# Patient Record
Sex: Female | Born: 2000 | Race: White | Hispanic: No | Marital: Single | Smoking: Former smoker
Health system: Southern US, Community
[De-identification: ages and names within clinical notes are randomized; demographics above are authoritative.]

## PROBLEM LIST (undated history)

## (undated) ENCOUNTER — Inpatient Hospital Stay (HOSPITAL_COMMUNITY): Payer: Self-pay

## (undated) ENCOUNTER — Ambulatory Visit (HOSPITAL_COMMUNITY): Admission: EM | Payer: MEDICAID | Source: Home / Self Care

## (undated) DIAGNOSIS — R519 Headache, unspecified: Secondary | ICD-10-CM

## (undated) DIAGNOSIS — S42301A Unspecified fracture of shaft of humerus, right arm, initial encounter for closed fracture: Secondary | ICD-10-CM

## (undated) DIAGNOSIS — I498 Other specified cardiac arrhythmias: Secondary | ICD-10-CM

## (undated) DIAGNOSIS — G90A Postural orthostatic tachycardia syndrome (POTS): Secondary | ICD-10-CM

## (undated) DIAGNOSIS — F419 Anxiety disorder, unspecified: Secondary | ICD-10-CM

## (undated) DIAGNOSIS — N390 Urinary tract infection, site not specified: Secondary | ICD-10-CM

## (undated) DIAGNOSIS — J351 Hypertrophy of tonsils: Secondary | ICD-10-CM

## (undated) DIAGNOSIS — J45909 Unspecified asthma, uncomplicated: Secondary | ICD-10-CM

## (undated) DIAGNOSIS — F32A Depression, unspecified: Secondary | ICD-10-CM

## (undated) DIAGNOSIS — S022XXA Fracture of nasal bones, initial encounter for closed fracture: Secondary | ICD-10-CM

## (undated) DIAGNOSIS — R51 Headache: Secondary | ICD-10-CM

## (undated) DIAGNOSIS — J302 Other seasonal allergic rhinitis: Secondary | ICD-10-CM

## (undated) DIAGNOSIS — S42302A Unspecified fracture of shaft of humerus, left arm, initial encounter for closed fracture: Secondary | ICD-10-CM

## (undated) HISTORY — PX: TONSILECTOMY, ADENOIDECTOMY, BILATERAL MYRINGOTOMY AND TUBES: SHX2538

## (undated) HISTORY — DX: Headache, unspecified: R51.9

## (undated) HISTORY — PX: TONSILLECTOMY: SUR1361

## (undated) HISTORY — DX: Headache: R51

---
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
## 2022-07-22 ENCOUNTER — Telehealth: Payer: Medicaid Other | Admitting: Physician Assistant

## 2022-07-22 DIAGNOSIS — K047 Periapical abscess without sinus: Secondary | ICD-10-CM

## 2022-07-22 MED ORDER — AMOXICILLIN-POT CLAVULANATE 875-125 MG PO TABS: 1.0000 | ORAL_TABLET | Freq: Two times a day (BID) | ORAL | 0 refills | Status: DC

## 2022-07-22 MED ORDER — MELOXICAM 15 MG PO TABS: 15.0000 mg | ORAL_TABLET | Freq: Every day | ORAL | 0 refills | Status: DC

## 2022-07-22 NOTE — Progress Notes (Signed)
Virtual Visit Consent   Robin Pineda, you are scheduled for a virtual visit with a [facility] provider today. Just as with appointments in the office, your consent must be obtained to participate. Your consent will be active for this visit and any virtual visit you may have with one of our providers in the next 365 days. If you have a MyChart account, a copy of this consent can be sent to you electronically.  As this is a virtual visit, video technology does not allow for your provider to perform a traditional examination. This may limit your provider's ability to fully assess your condition. If your provider identifies any concerns that need to be evaluated in person or the need to arrange testing (such as labs, EKG, etc.), we will make arrangements to do so. Although advances in technology are sophisticated, we cannot ensure that it will always work on either your end or our end. If the connection with a video visit is poor, the visit may have to be switched to a telephone visit. With either a video or telephone visit, we are not always able to ensure that we have a secure connection.  By engaging in this virtual visit, you consent to the provision of healthcare and authorize for your insurance to be billed (if applicable) for the services provided during this visit. Depending on your insurance coverage, you may receive a charge related to this service.  I need to obtain your verbal consent now. Are you willing to proceed with your visit today? Robin Pineda has provided verbal consent on 07/22/2022 for a virtual visit (video or telephone). Leeanne Rio, [place]  Date: 07/22/2022 10:01 AM  Virtual Visit via Video Note   I, Leeanne Rio, connected with  Robin Pineda  ([id number], 2001-04-26) on 07/22/22 at  9:45 AM EDT by a video-enabled telemedicine application and verified that I am speaking with the correct person using two identifiers.  Location: Patient: Virtual Visit  Location Patient: Home Provider: Virtual Visit Location Provider: Home Office   I discussed the limitations of evaluation and management by telemedicine and the availability of in person appointments. The patient expressed understanding and agreed to proceed.    History of Present Illness: Robin Pineda is a 22 y.o. who identifies as a female who was assigned female at birth, and is being seen today for tooth infection. Was supposed to have a top let premolar pulled last week but was unable to make it to her appointment. Has not been rescheduled yet for extraction. Over past couple of days with increased pain in this tooth now with gingival swelling. Denies fever, chills. Taking OTC Tylenol but only mildly beneficial.    HPI: HPI  Problems:  Patient Active Problem List   Diagnosis Date Noted   MDD (major depressive disorder), recurrent, severe, with psychosis ([facility]) 11/06/2017   Migraine without aura and without status migrainosus, not intractable 03/04/2014   Episodic tension-type headache 03/04/2014   Obesity 03/04/2014   Disequilibrium 03/04/2014    Allergies:  Allergies  Allergen Reactions   Cinnamon Hives   Cinnamon Swelling   Medications:  Current Outpatient Medications:    amoxicillin-clavulanate (AUGMENTIN) 875-125 MG tablet, Take 1 tablet by mouth 2 (two) times daily., Disp: 14 tablet, Rfl: 0   meloxicam (MOBIC) 15 MG tablet, Take 1 tablet (15 mg total) by mouth daily., Disp: 15 tablet, Rfl: 0  Observations/Objective: Patient is well-developed, well-nourished in no acute distress.  Resting comfortably  at home.  Head is normocephalic, atraumatic.  No labored breathing.  Speech is clear and coherent with logical content.  Patient is alert and oriented at baseline.    Assessment and Plan: 1. Dental infection - amoxicillin-clavulanate (AUGMENTIN) 875-125 MG tablet; Take 1 tablet by mouth 2 (two) times daily.  Dispense: 14 tablet; Refill: 0 - meloxicam (MOBIC) 15 MG  tablet; Take 1 tablet (15 mg total) by mouth daily.  Dispense: 15 tablet; Refill: 0  Start Augmentin per orders. Ok to continue Tylenol OTC. Will add-on Meloxicam once daily. Recommend Peroxyl mouthwash and use of clove paste applied topically.   Follow Up Instructions: I discussed the assessment and treatment plan with the patient. The patient was provided an opportunity to ask questions and all were answered. The patient agreed with the plan and demonstrated an understanding of the instructions.  A copy of instructions were sent to the patient via MyChart unless otherwise noted below.   The patient was advised to call back or seek an in-person evaluation if the symptoms worsen or if the condition fails to improve as anticipated.  Time:  I spent 10 minutes with the patient via telehealth technology discussing the above problems/concerns.    Leeanne Rio, PA-C

## 2022-07-22 NOTE — Patient Instructions (Addendum)
  Robin Pineda, thank you for joining Leeanne Rio, PA-C for today's virtual visit.  While this provider is not your primary care provider (PCP), if your PCP is located in our provider database this encounter information will be shared with them immediately following your visit.   [facility] account gives you access to today's visit and all your visits, tests, and labs performed at [facility] " click here if you don't have a [facility] account or go to [email or web address]  Consent: (Patient) Robin Pineda provided verbal consent for this virtual visit at the beginning of the encounter.  Current Medications:  Current Outpatient Medications:    doxycycline (VIBRA-TABS) 100 MG tablet, Take 1 tablet (100 mg total) by mouth 2 (two) times daily., Disp: 20 tablet, Rfl: 0   EYSUVIS 0.25 % SUSP, Apply 1 drop to eye 2 (two) times daily., Disp: , Rfl:    famotidine (PEPCID) 20 MG tablet, Take 1 tablet (20 mg total) by mouth 2 (two) times daily., Disp: 30 tablet, Rfl: 0   hydrOXYzine (ATARAX) 25 MG tablet, Take 25 mg by mouth every 8 (eight) hours as needed., Disp: , Rfl:    predniSONE (DELTASONE) 20 MG tablet, 3 tabs po daily x 3 days, then 2 tabs x 3 days, then 1.5 tabs x 3 days, then 1 tab x 3 days, then 0.5 tabs x 3 days, Disp: 27 tablet, Rfl: 0   RESTASIS 0.05 % ophthalmic emulsion, 1 drop 2 (two) times daily., Disp: , Rfl:    Medications ordered in this encounter:  No orders of the defined types were placed in this encounter.    *If you need refills on other medications prior to your next appointment, please contact your pharmacy*  Follow-Up: Call back or seek an in-person evaluation if the symptoms worsen or if the condition fails to improve as anticipated.  [facility] [phone]  Other Instructions Please continue dental hygiene.  Start use of OTC Peroxyl mouthwash 1-2 x daily.  You can use a clove paste as we  discussed, to apply topically to the area.  Use the Meloxicam once daily for pain and inflammation. Ok to use OTC Tylenol throughout the day. Call your dentist first thing Monday morning to reschedule your extraction.    If you have been instructed to have an in-person evaluation today at a local Urgent Care facility, please use the link below. It will take you to a list of all of our available [facility], including address, phone number and hours of operation. Please do not delay care.  [facility]  If you or a family member do not have a primary care provider, use the link below to schedule a visit and establish care. When you choose a [facility] primary care physician or advanced practice provider, you gain a long-term partner in health. Find a Primary Care Provider  Learn more about [facility]'s in-office and virtual care options: [facility]

## 2022-08-20 IMAGING — DX DG ANKLE COMPLETE 3+V*R*
3 series · 3 of 3 positions shown · non-contrast
Comparison: 04/21/2014

CLINICAL DATA: Swelling and limited range of motion

EXAM:
RIGHT ANKLE - COMPLETE 3+ VIEW

[ankle ap]
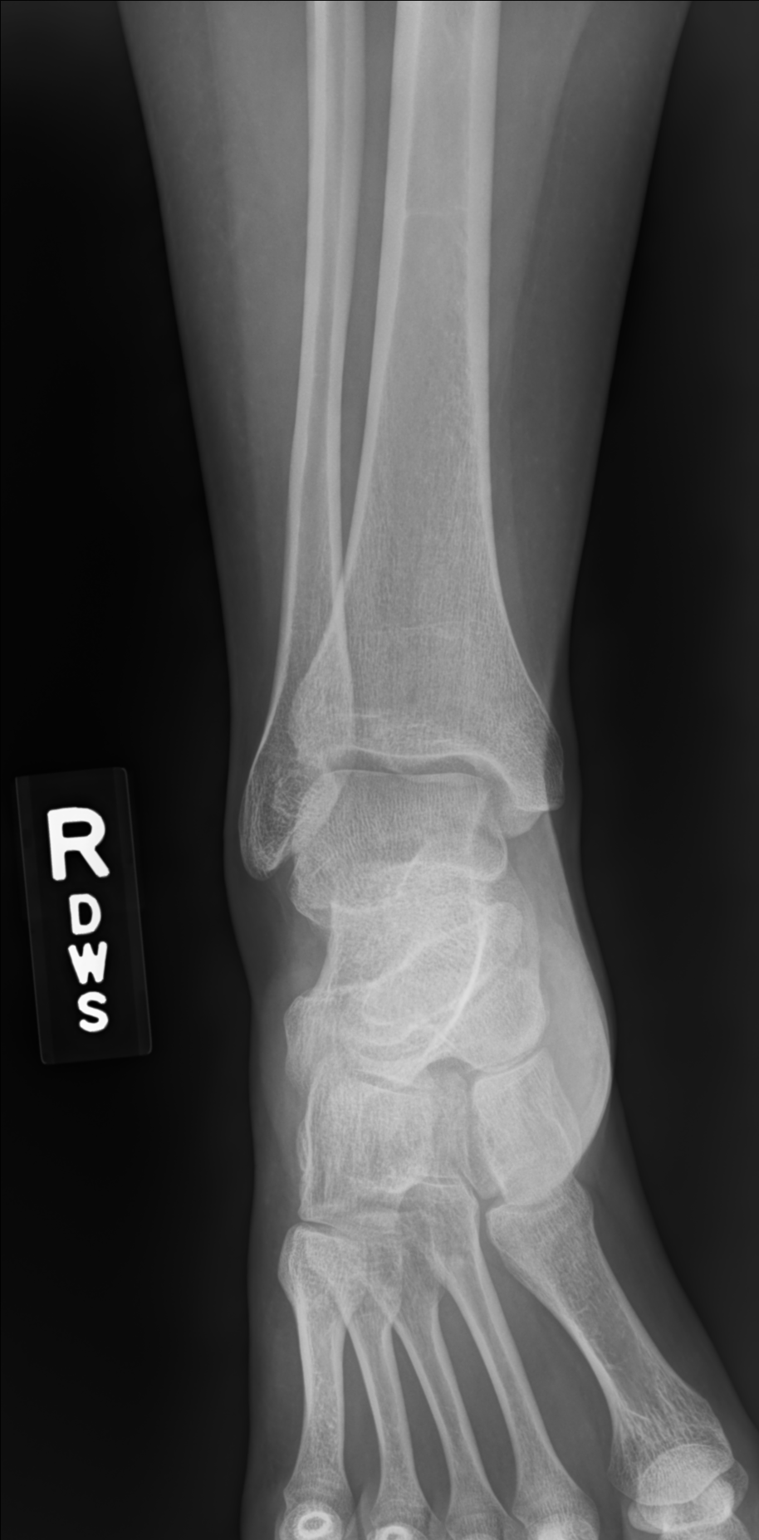

[ankle medial oblique]
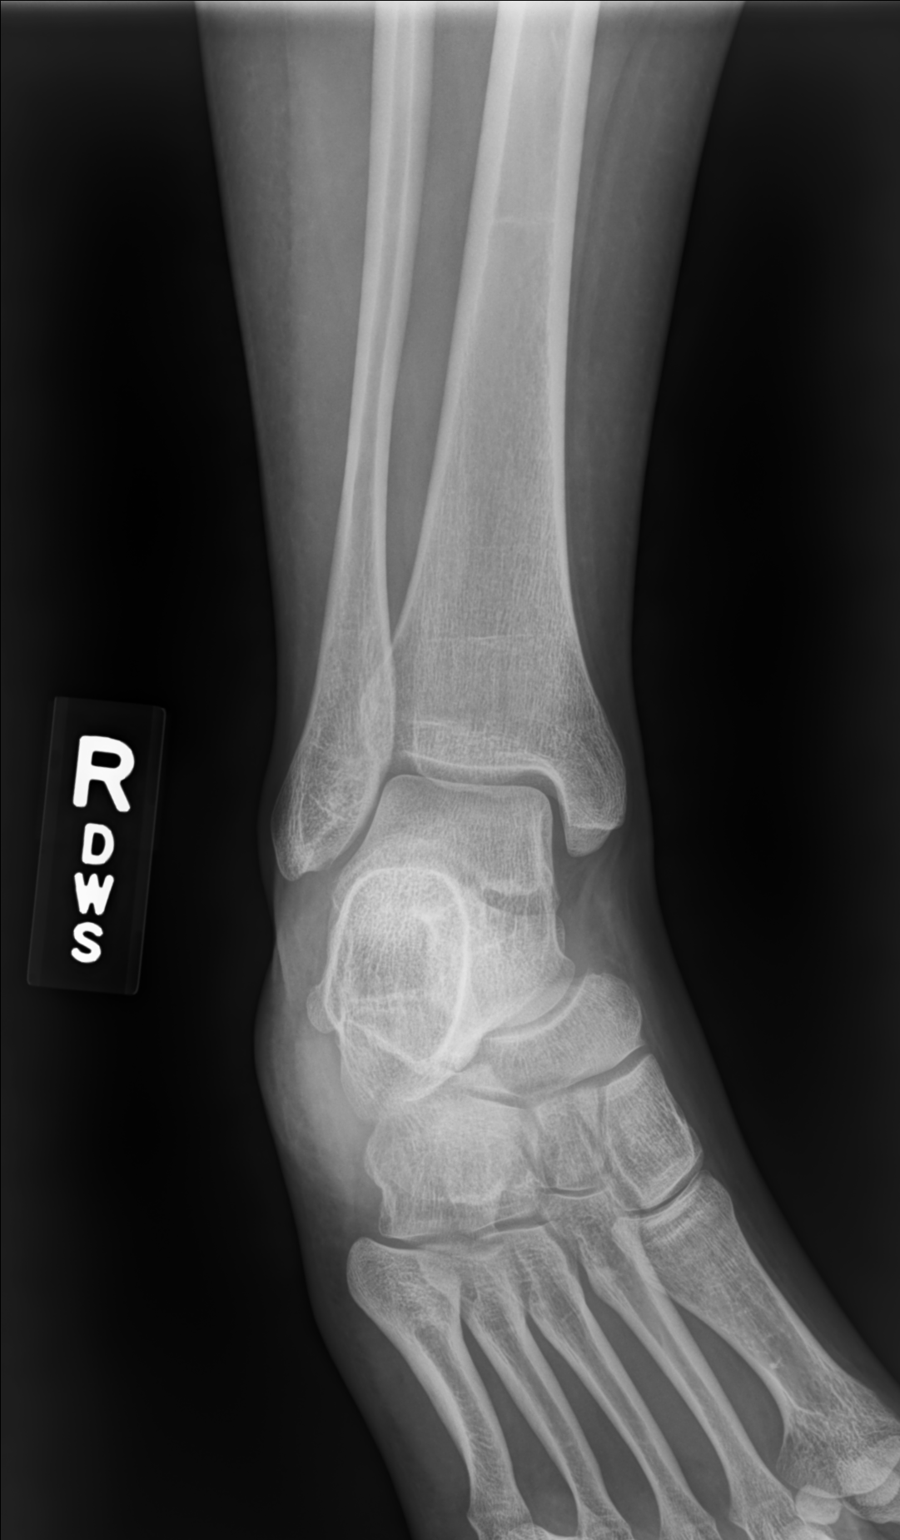

[ankle lat]
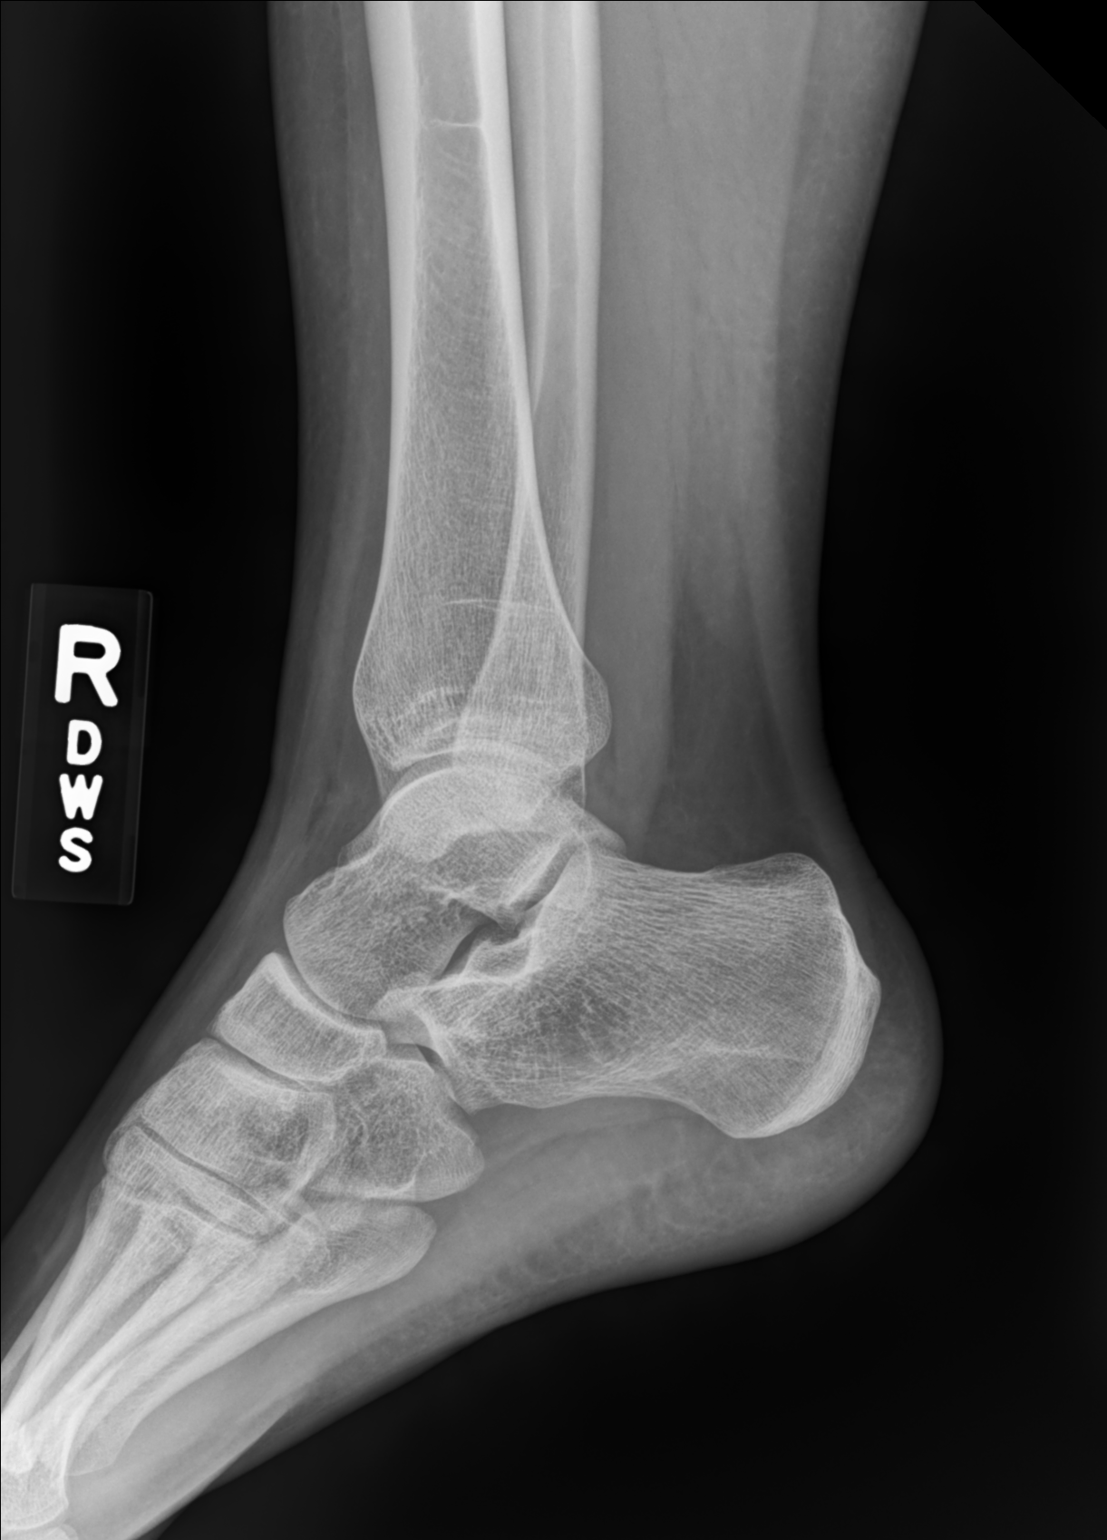

[3 of 3 positions shown; findings below may reference images not displayed]

FINDINGS: There is no evidence of fracture, dislocation, or joint effusion.
There is no evidence of arthropathy or other focal bone abnormality.
Soft tissues are unremarkable.
IMPRESSION: Negative.

## 2022-08-20 IMAGING — DX DG KNEE COMPLETE 4+V*R*
4 series · 4 of 4 positions shown · non-contrast
Comparison: None.

CLINICAL DATA: Swelling and limited range of motion

EXAM:
RIGHT KNEE - COMPLETE 4+ VIEW

[knee standing ap]
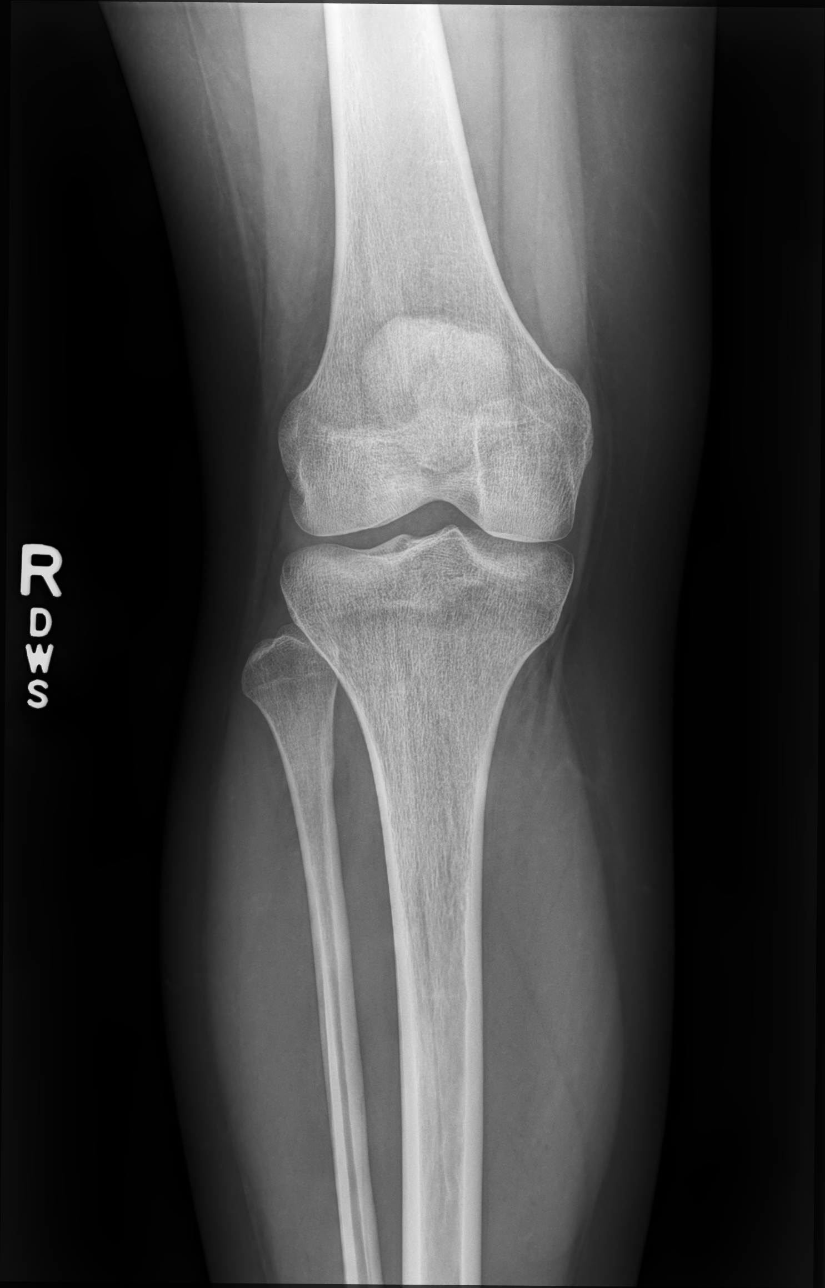

[knee mlo (1 of 2)]
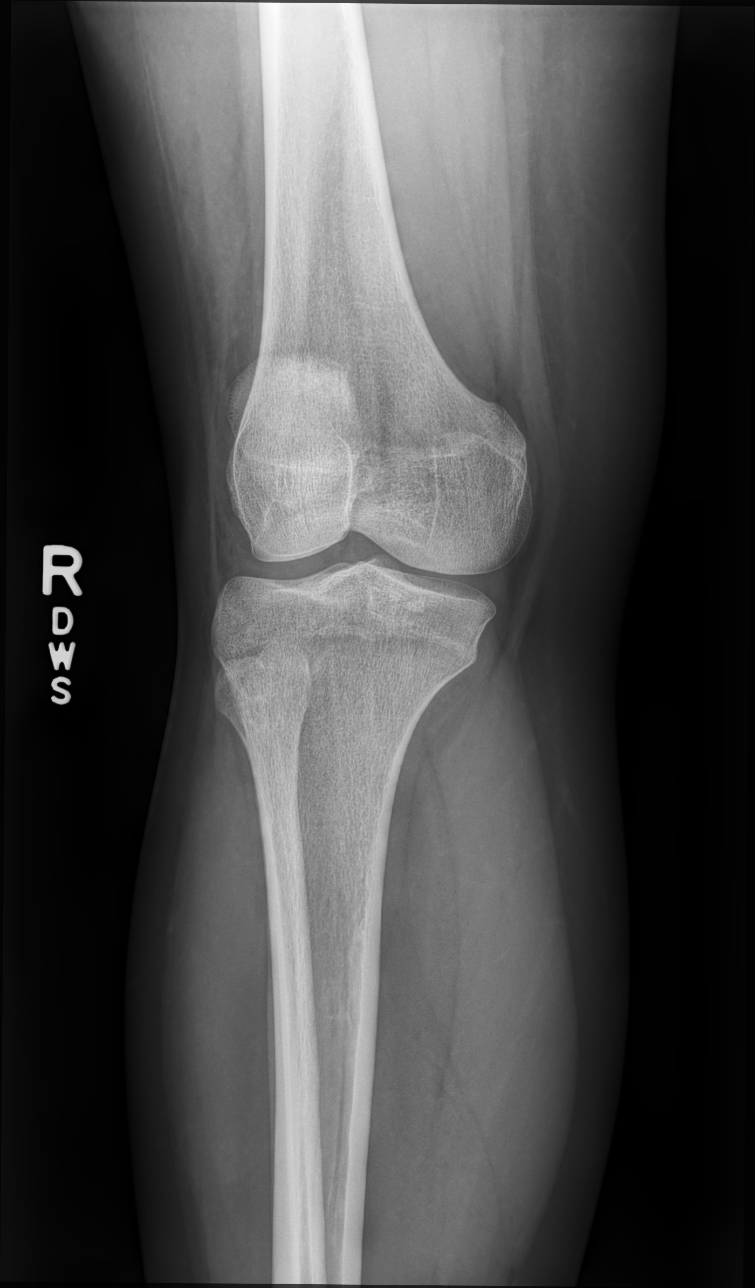

[knee mlo (2 of 2)]
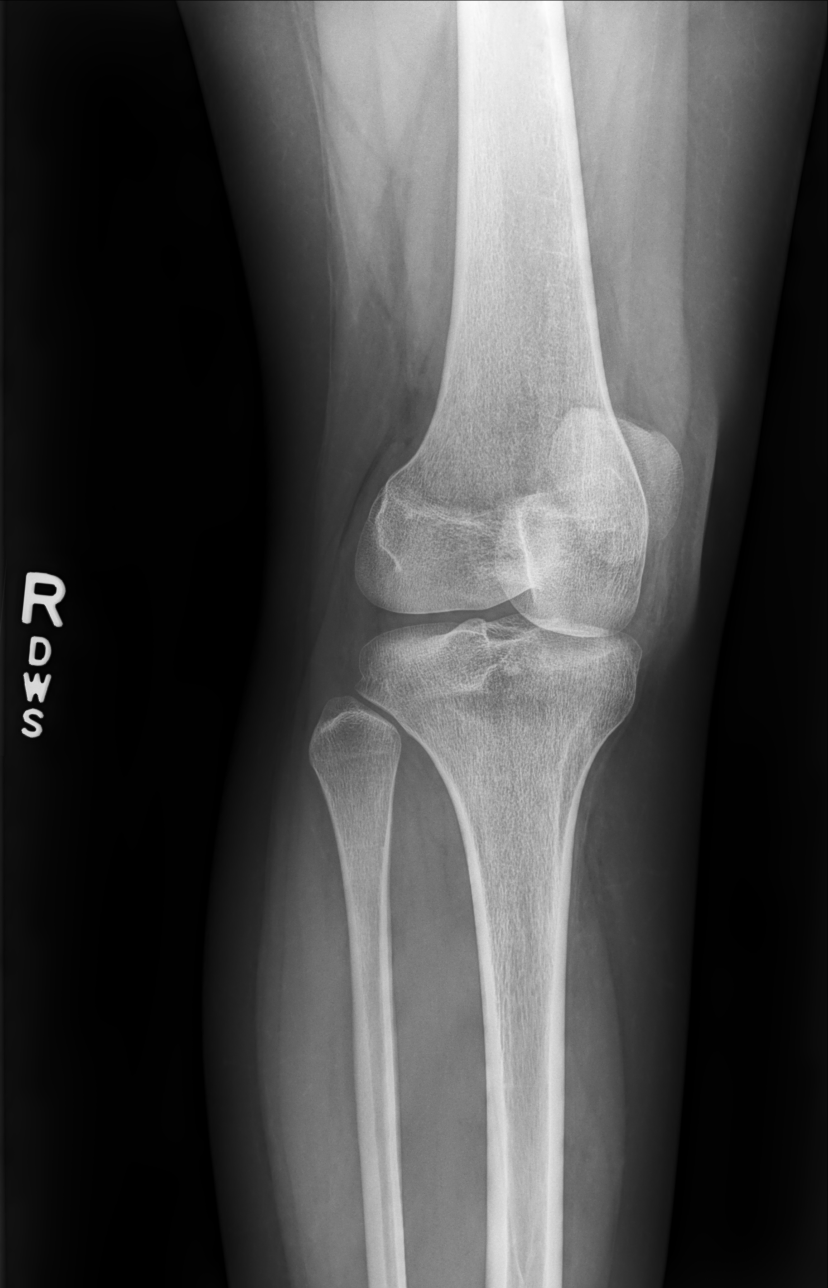

[knee lat]
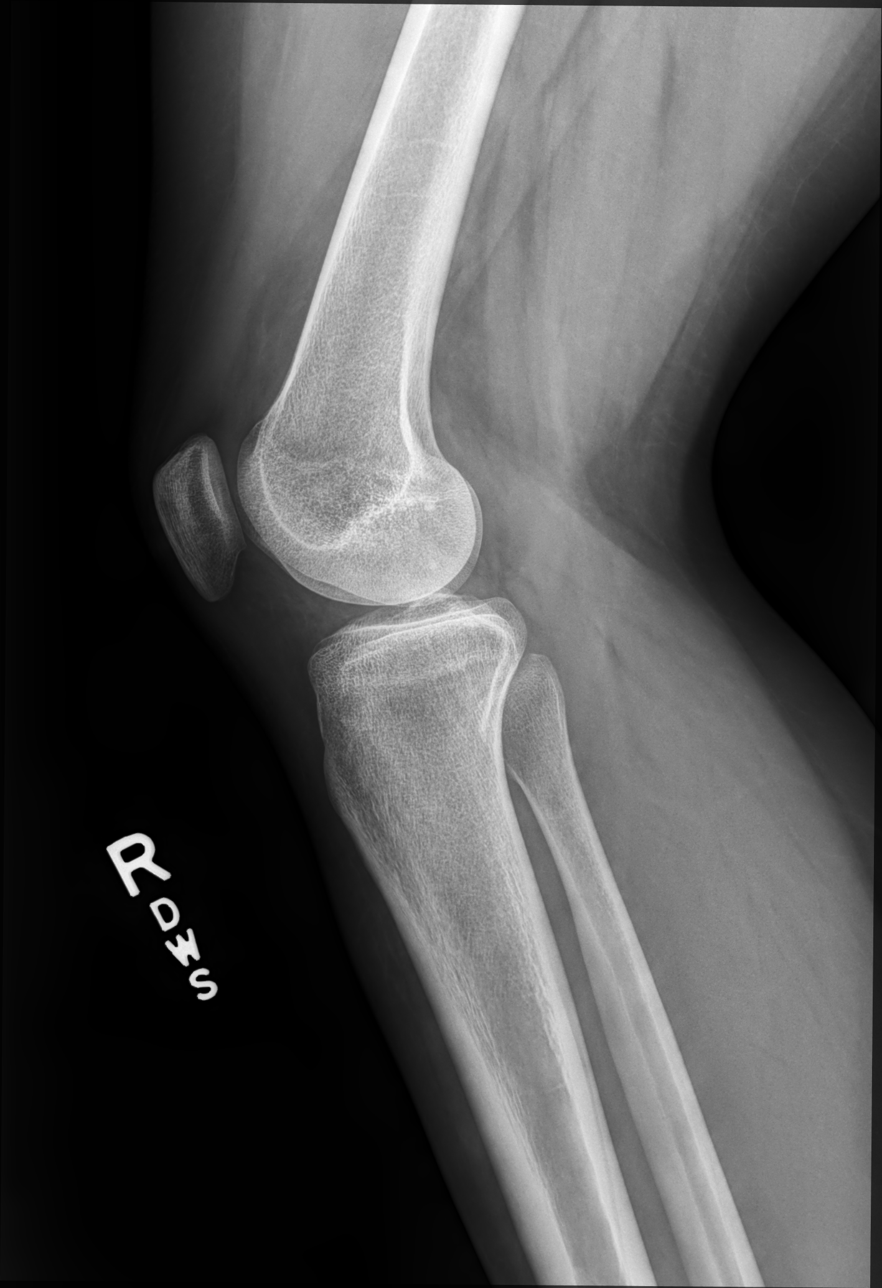

[4 of 4 positions shown; findings below may reference images not displayed]

FINDINGS: No fracture or malalignment. Probable small knee effusion. Joint
spaces are maintained
IMPRESSION: Probable small knee effusion. No acute osseous abnormality.

## 2022-09-08 ENCOUNTER — Telehealth: Payer: Medicaid Other | Admitting: Physician Assistant

## 2022-09-08 ENCOUNTER — Telehealth: Payer: Self-pay | Admitting: Physician Assistant

## 2022-09-08 DIAGNOSIS — J069 Acute upper respiratory infection, unspecified: Secondary | ICD-10-CM

## 2022-09-08 DIAGNOSIS — B9689 Other specified bacterial agents as the cause of diseases classified elsewhere: Secondary | ICD-10-CM

## 2022-09-08 MED ORDER — AMOXICILLIN-POT CLAVULANATE 875-125 MG PO TABS: 1.0000 | ORAL_TABLET | Freq: Two times a day (BID) | ORAL | 0 refills | Status: DC

## 2022-09-08 MED ORDER — ALBUTEROL SULFATE HFA 108 (90 BASE) MCG/ACT IN AERS: 1.0000 | INHALATION_SPRAY | Freq: Four times a day (QID) | RESPIRATORY_TRACT | 0 refills | Status: DC | PRN

## 2022-09-08 MED ORDER — PROMETHAZINE-DM 6.25-15 MG/5ML PO SYRP: 5.0000 mL | ORAL_SOLUTION | Freq: Four times a day (QID) | ORAL | 0 refills | Status: DC | PRN

## 2022-09-08 NOTE — Progress Notes (Signed)
The patient no-showed for appointment despite this provider sending direct link with no response and waiting for at least 10 minutes from appointment time for patient to join. They will be marked as a NS for this appointment/time.   Uchenna Rappaport Cody Cheston Coury, PA-C    

## 2022-09-08 NOTE — Patient Instructions (Signed)
Robin Pineda, thank you for joining Margaretann Loveless, PA-C for today's virtual visit.  While this provider is not your primary care provider (PCP), if your PCP is located in our provider database this encounter information will be shared with them immediately following your visit.   [facility] MyChart account gives you access to today's visit and all your visits, tests, and labs performed at [facility] " click here if you don't have a [facility] MyChart account or go to [email or web address]  Consent: (Patient) Robin Pineda provided verbal consent for this virtual visit at the beginning of the encounter.  Current Medications:  Current Outpatient Medications:    albuterol (VENTOLIN HFA) 108 (90 Base) MCG/ACT inhaler, Inhale 1-2 puffs into the lungs every 6 (six) hours as needed., Disp: 8 g, Rfl: 0   amoxicillin-clavulanate (AUGMENTIN) 875-125 MG tablet, Take 1 tablet by mouth 2 (two) times daily., Disp: 14 tablet, Rfl: 0   promethazine-dextromethorphan (PROMETHAZINE-DM) 6.25-15 MG/5ML syrup, Take 5 mLs by mouth 4 (four) times daily as needed., Disp: 118 mL, Rfl: 0   meloxicam (MOBIC) 15 MG tablet, Take 1 tablet (15 mg total) by mouth daily., Disp: 15 tablet, Rfl: 0   Medications ordered in this encounter:  Meds ordered this encounter  Medications   amoxicillin-clavulanate (AUGMENTIN) 875-125 MG tablet    Sig: Take 1 tablet by mouth 2 (two) times daily.    Dispense:  14 tablet    Refill:  0    Order Specific Question:   Supervising Provider    Answer:   Loreli Dollar   albuterol (VENTOLIN HFA) 108 (90 Base) MCG/ACT inhaler    Sig: Inhale 1-2 puffs into the lungs every 6 (six) hours as needed.    Dispense:  8 g    Refill:  0    Order Specific Question:   Supervising Provider    Answer:   Merrilee Jansky [id number]   promethazine-dextromethorphan (PROMETHAZINE-DM) 6.25-15 MG/5ML syrup    Sig: Take 5 mLs by mouth 4 (four) times daily as  needed.    Dispense:  118 mL    Refill:  0    Order Specific Question:   Supervising Provider    Answer:   Merrilee Jansky [[id number]]     *If you need refills on other medications prior to your next appointment, please contact your pharmacy*  Follow-Up: Call back or seek an in-person evaluation if the symptoms worsen or if the condition fails to improve as anticipated.  [facility] [phone]  Other Instructions Upper Respiratory Infection, Adult An upper respiratory infection (URI) is a common viral infection of the nose, throat, and upper air passages that lead to the lungs. The most common type of URI is the common cold. URIs usually get better on their own, without medical treatment. What are the causes? A URI is caused by a virus. You may catch a virus by: Breathing in droplets from an infected person's cough or sneeze. Touching something that has been exposed to the virus (is contaminated) and then touching your mouth, nose, or eyes. What increases the risk? You are more likely to get a URI if: You are very young or very old. You have close contact with others, such as at work, school, or a health care facility. You smoke. You have long-term (chronic) heart or lung disease. You have a weakened disease-fighting system (immune system). You have nasal allergies or asthma. You are experiencing a lot of stress.  You have poor nutrition. What are the signs or symptoms? A URI usually involves some of the following symptoms: Runny or stuffy (congested) nose. Cough. Sneezing. Sore throat. Headache. Fatigue. Fever. Loss of appetite. Pain in your forehead, behind your eyes, and over your cheekbones (sinus pain). Muscle aches. Redness or irritation of the eyes. Pressure in the ears or face. How is this diagnosed? This condition may be diagnosed based on your medical history and symptoms, and a physical exam. Your health care provider may use a swab to take a  mucus sample from your nose (nasal swab). This sample can be tested to determine what virus is causing the illness. How is this treated? URIs usually get better on their own within 7-10 days. Medicines cannot cure URIs, but your health care provider may recommend certain medicines to help relieve symptoms, such as: Over-the-counter cold medicines. Cough suppressants. Coughing is a type of defense against infection that helps to clear the respiratory system, so take these medicines only as recommended by your health care provider. Fever-reducing medicines. Follow these instructions at home: Activity Rest as needed. If you have a fever, stay home from work or school until your fever is gone or until your health care provider says your URI cannot spread to other people (is no longer contagious). Your health care provider may have you wear a face mask to prevent your infection from spreading. Relieving symptoms Gargle with a mixture of salt and water 3-4 times a day or as needed. To make salt water, completely dissolve -1 tsp (3-6 g) of salt in 1 cup (237 mL) of warm water. Use a cool-mist humidifier to add moisture to the air. This can help you breathe more easily. Eating and drinking  Drink enough fluid to keep your urine pale yellow. Eat soups and other clear broths. General instructions  Take over-the-counter and prescription medicines only as told by your health care provider. These include cold medicines, fever reducers, and cough suppressants. Do not use any products that contain nicotine or tobacco. These products include cigarettes, chewing tobacco, and vaping devices, such as e-cigarettes. If you need help quitting, ask your health care provider. Stay away from secondhand smoke. Stay up to date on all immunizations, including the yearly (annual) flu vaccine. Keep all follow-up visits. This is important. How to prevent the spread of infection to others URIs can be contagious. To  prevent the infection from spreading: Wash your hands with soap and water for at least 20 seconds. If soap and water are not available, use hand sanitizer. Avoid touching your mouth, face, eyes, or nose. Cough or sneeze into a tissue or your sleeve or elbow instead of into your hand or into the air.  Contact a health care provider if: You are getting worse instead of better. You have a fever or chills. Your mucus is brown or red. You have yellow or brown discharge coming from your nose. You have pain in your face, especially when you bend forward. You have swollen neck glands. You have pain while swallowing. You have white areas in the back of your throat. Get help right away if: You have shortness of breath that gets worse. You have severe or persistent: Headache. Ear pain. Sinus pain. Chest pain. You have chronic lung disease along with any of the following: Making high-pitched whistling sounds when you breathe, most often when you breathe out (wheezing). Prolonged cough (more than 14 days). Coughing up blood. A change in your usual mucus. You have a  stiff neck. You have changes in your: Vision. Hearing. Thinking. Mood. These symptoms may be an emergency. Get help right away. Call 911. Do not wait to see if the symptoms will go away. Do not drive yourself to the hospital. Summary An upper respiratory infection (URI) is a common infection of the nose, throat, and upper air passages that lead to the lungs. A URI is caused by a virus. URIs usually get better on their own within 7-10 days. Medicines cannot cure URIs, but your health care provider may recommend certain medicines to help relieve symptoms. This information is not intended to replace advice given to you by your health care provider. Make sure you discuss any questions you have with your health care provider. Document Revised: 11/25/2020 Document Reviewed: 11/25/2020 Elsevier Patient Education  2023 Elsevier  Inc.    If you have been instructed to have an in-person evaluation today at a local Urgent Care facility, please use the link below. It will take you to a list of all of our available [facility], including address, phone number and hours of operation. Please do not delay care.  [facility]  If you or a family member do not have a primary care provider, use the link below to schedule a visit and establish care. When you choose a [facility] primary care physician or advanced practice provider, you gain a long-term partner in health. Find a Primary Care Provider  Learn more about [facility]'s in-office and virtual care options: [facility] - Get Care Now

## 2022-09-08 NOTE — Progress Notes (Signed)
Virtual Visit Consent   Robin Pineda, you are scheduled for a virtual visit with a [facility] provider today. Just as with appointments in the office, your consent must be obtained to participate. Your consent will be active for this visit and any virtual visit you may have with one of our providers in the next 365 days. If you have a MyChart account, a copy of this consent can be sent to you electronically.  As this is a virtual visit, video technology does not allow for your provider to perform a traditional examination. This may limit your provider's ability to fully assess your condition. If your provider identifies any concerns that need to be evaluated in person or the need to arrange testing (such as labs, EKG, etc.), we will make arrangements to do so. Although advances in technology are sophisticated, we cannot ensure that it will always work on either your end or our end. If the connection with a video visit is poor, the visit may have to be switched to a telephone visit. With either a video or telephone visit, we are not always able to ensure that we have a secure connection.  By engaging in this virtual visit, you consent to the provision of healthcare and authorize for your insurance to be billed (if applicable) for the services provided during this visit. Depending on your insurance coverage, you may receive a charge related to this service.  I need to obtain your verbal consent now. Are you willing to proceed with your visit today? Robin Pineda has provided verbal consent on 09/08/2022 for a virtual visit (video or telephone). Margaretann Loveless, PA-C  Date: 09/08/2022 2:07 PM  Virtual Visit via Video Note   I, Margaretann Loveless, connected with  Robin Pineda  ([id number], 2000-08-19) on 09/08/22 at  2:00 PM EDT by a video-enabled telemedicine application and verified that I am speaking with the correct person using two identifiers.  Location: Patient: Virtual Visit  Location Patient: Home Provider: Virtual Visit Location Provider: Home Office   I discussed the limitations of evaluation and management by telemedicine and the availability of in person appointments. The patient expressed understanding and agreed to proceed.    History of Present Illness: Robin Pineda is a 22 y.o. who identifies as a female who was assigned female at birth, and is being seen today for URI symptoms.  HPI: URI  This is a new problem. The current episode started 1 to 4 weeks ago. The problem has been gradually worsening. Associated symptoms include chest pain (tightness), congestion, coughing, ear pain (left), headaches, a plugged ear sensation (left), rhinorrhea (and post nasal drainage), sinus pain, a sore throat, vomiting (from coughing) and wheezing. Pertinent negatives include no diarrhea or nausea. Treatments tried: dayquil, nyquil. The treatment provided no relief.     Problems:  Patient Active Problem List   Diagnosis Date Noted   MDD (major depressive disorder), recurrent, severe, 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   Migraine without aura and without status migrainosus, not intractable 03/04/2014   Episodic tension-type headache 03/04/2014   Obesity 03/04/2014   Disequilibrium 03/04/2014    Allergies:  Allergies  Allergen Reactions   Cinnamon Hives   Cinnamon Swelling   Medications:  Current Outpatient Medications:    albuterol (VENTOLIN HFA) 108 (90 Base) MCG/ACT inhaler, Inhale 1-2 puffs into the lungs every 6 (six) hours as needed., Disp: 8 g, Rfl: 0   amoxicillin-clavulanate (AUGMENTIN) 875-125 MG tablet, Take 1 tablet by mouth 2 (  two) times daily., Disp: 14 tablet, Rfl: 0   promethazine-dextromethorphan (PROMETHAZINE-DM) 6.25-15 MG/5ML syrup, Take 5 mLs by mouth 4 (four) times daily as needed., Disp: 118 mL, Rfl: 0   meloxicam (MOBIC) 15 MG tablet, Take 1 tablet (15 mg total) by mouth daily., Disp: 15 tablet, Rfl: 0   Observations/Objective: Patient  is well-developed, well-nourished in no acute distress.  Resting comfortably at home.  Head is normocephalic, atraumatic.  No labored breathing.  Speech is clear and coherent with logical content.  Patient is alert and oriented at baseline.    Assessment and Plan: 1. Bacterial upper respiratory infection - amoxicillin-clavulanate (AUGMENTIN) 875-125 MG tablet; Take 1 tablet by mouth 2 (two) times daily.  Dispense: 14 tablet; Refill: 0 - albuterol (VENTOLIN HFA) 108 (90 Base) MCG/ACT inhaler; Inhale 1-2 puffs into the lungs every 6 (six) hours as needed.  Dispense: 8 g; Refill: 0 - promethazine-dextromethorphan (PROMETHAZINE-DM) 6.25-15 MG/5ML syrup; Take 5 mLs by mouth 4 (four) times daily as needed.  Dispense: 118 mL; Refill: 0  - Worsening symptoms that have not responded to OTC medications.  - Will give Augmentin - Promethazine DM for cough - Albuterol for chest tightness and wheezing - Continue allergy medications.  - Steam and humidifier can help - Stay well hydrated and get plenty of rest.  - Seek in person evaluation if no symptom improvement or if symptoms worsen   Follow Up Instructions: I discussed the assessment and treatment plan with the patient. The patient was provided an opportunity to ask questions and all were answered. The patient agreed with the plan and demonstrated an understanding of the instructions.  A copy of instructions were sent to the patient via MyChart unless otherwise noted below.    The patient was advised to call back or seek an in-person evaluation if the symptoms worsen or if the condition fails to improve as anticipated.  Time:  I spent 10 minutes with the patient via telehealth technology discussing the above problems/concerns.    Margaretann Loveless, PA-C

## 2022-09-12 ENCOUNTER — Telehealth: Payer: Self-pay | Admitting: Physician Assistant

## 2022-09-12 DIAGNOSIS — M25551 Pain in right hip: Secondary | ICD-10-CM

## 2022-09-12 DIAGNOSIS — M25552 Pain in left hip: Secondary | ICD-10-CM

## 2022-09-12 NOTE — Patient Instructions (Signed)
Robin Pineda, thank you for joining Margaretann Loveless, PA-C for today's virtual visit.  While this provider is not your primary care provider (PCP), if your PCP is located in our provider database this encounter information will be shared with them immediately following your visit.   A [facility] MyChart account gives you access to today's visit and all your visits, tests, and labs performed at [facility] " click here if you don't have a [facility] [place] MyChart account or go to [email or web address]  Consent: (Patient) Robin Pineda provided verbal consent for this virtual visit at the beginning of the encounter.  Current Medications:  Current Outpatient Medications:    albuterol (VENTOLIN HFA) 108 (90 Base) MCG/ACT inhaler, Inhale 1-2 puffs into the lungs every 6 (six) hours as needed., Disp: 8 g, Rfl: 0   amoxicillin-clavulanate (AUGMENTIN) 875-125 MG tablet, Take 1 tablet by mouth 2 (two) times daily., Disp: 14 tablet, Rfl: 0   meloxicam (MOBIC) 15 MG tablet, Take 1 tablet (15 mg total) by mouth daily., Disp: 15 tablet, Rfl: 0   promethazine-dextromethorphan (PROMETHAZINE-DM) 6.25-15 MG/5ML syrup, Take 5 mLs by mouth 4 (four) times daily as needed., Disp: 118 mL, Rfl: 0   Medications ordered in this encounter:  No orders of the defined types were placed in this encounter.    *If you need refills on other medications prior to your next appointment, please contact your pharmacy*  Follow-Up: Call back or seek an in-person evaluation if the symptoms worsen or if the condition fails to improve as anticipated.  [facility] [phone]  Other Instructions  Rikki Spearing [address] [place], [place] [address] URGENT CARE HOURS Monday - Friday: 8:00am to 8:00pm Saturday: 10:00am to 3:00pm [phone]   Hip Exercises Ask your health care provider which exercises are safe for you. Do exercises exactly as told by your provider and  adjust them as told. It is normal to feel mild stretching, pulling, tightness, or discomfort as you do these exercises. Stop right away if you feel sudden pain or your pain gets worse. Do not begin these exercises until told by your provider. Stretching and range-of-motion exercises These exercises warm up your muscles and joints and improve the movement and flexibility of your hip. They also help to relieve pain, numbness, and tingling. You may be asked to limit your range of motion if you had a hip replacement. Talk to your provider about these limits. Hamstrings, supine  Lie on your back (supine position). Loop a belt, towel, or exercise band over the ball of your left / right foot. The ball of your foot is on the walking surface, right under your toes. Straighten your left / right knee and slowly pull on the belt, towel, or band to raise your leg until you feel a gentle stretch behind your knee (hamstring). Do not let your knee bend while you do this. Keep your other leg flat on the floor. Hold this position for __________ seconds. Slowly return your leg to the starting position. Repeat __________ times. Complete this exercise __________ times a day. Hip rotation  Lie on your back on a firm surface. With your left / right hand, gently pull your left / right knee toward the shoulder that is on the same side of the body. Stop when your knee is pointing toward the ceiling. Hold your left / right ankle with your other hand. Keeping your knee steady, gently pull your left / right ankle toward your other shoulder  until you feel a stretch in your butt. Keep your hips and shoulders firmly planted while you do this stretch. Hold this position for __________ seconds. Repeat __________ times. Complete this exercise __________ times a day. Seated stretch This exercise is sometimes called hamstrings and adductors stretch. Sit on the floor with your legs stretched wide. Keep your knees straight during  this exercise. Keeping your head and back in a straight line, bend at your waist to reach for your left foot (position A). You should feel a stretch in your right inner thigh (adductors). Hold this position for __________ seconds. Then slowly return to the upright position. Keeping your head and back in a straight line, bend at your waist to reach forward (position B). You should feel a stretch behind both of your thighs and knees (hamstrings). Hold this position for __________ seconds. Then slowly return to the upright position. Keeping your head and back in a straight line, bend at your waist to reach for your right foot (position C). You should feel a stretch in your left inner thigh (adductors). Hold this position for __________ seconds. Then slowly return to the upright position. Repeat __________ times. Complete this exercise __________ times a day. Lunge This exercise stretches the muscles of the hip (hip flexors). Place your left / right knee on the floor and bend your other knee so that is directly over your ankle. You should be half-kneeling. Keep good posture with your head over your shoulders. Tighten your butt muscles to point your tailbone downward. This will prevent your back from arching too much. You should feel a gentle stretch in the front of your left / right thigh and hip. If you do not feel a stretch, slide your other foot forward slightly and then slowly lunge forward with your chest up until your knee once again lines up over your ankle. Make sure your tailbone continues to point downward. Hold this position for __________ seconds. Slowly return to the starting position. Repeat __________ times. Complete this exercise __________ times a day. Strengthening exercises These exercises build strength and endurance in your hip. Endurance is the ability to use your muscles for a long time, even after they get tired. Bridge This exercise strengthens the muscles of your hip (hip  extensors). Lie on your back on a firm surface with your knees bent and your feet flat on the floor. Tighten your butt muscles and lift your bottom off the floor until the trunk of your body and your hips are level with your thighs. Do not arch your back. You should feel the muscles working in your butt and the back of your thighs. If you do not feel these muscles, slide your feet 1-2 inches (2.5-5 cm) farther away from your butt. Hold this position for __________ seconds. Slowly lower your hips to the starting position. Let your muscles relax completely between repetitions. Repeat __________ times. Complete this exercise __________ times a day. Straight leg raises, side-lying This exercise strengthens the muscles that move the hip joint away from the center of the body (hip abductors). Lie on your side with your left / right leg in the top position. Lie so your head, shoulder, hip, and knee line up. You may bend your bottom knee slightly to help you balance. Roll your hips slightly forward, so your hips are stacked directly over each other and your left / right knee is facing forward. Leading with your heel, lift your top leg 4-6 inches (10-15 cm). You should feel  the muscles in your top hip lifting. Do not let your foot drift forward. Do not let your knee roll toward the ceiling. Hold this position for __________ seconds. Slowly return to the starting position. Let your muscles relax completely between repetitions. Repeat __________ times. Complete this exercise __________ times a day. Straight leg raises, side-lying This exercise strengthens the muscles that move the hip joint toward the center of the body (hip adductors). Lie on your side with your left / right leg in the bottom position. Lie so your head, shoulder, hip, and knee line up. You may place your upper foot in front to help you balance. Roll your hips slightly forward, so your hips are stacked directly over each other and your  left / right knee is facing forward. Tense the muscles in your inner thigh and lift your bottom leg 4-6 inches (10-15 cm). Hold this position for __________ seconds. Slowly return to the starting position. Let your muscles relax completely between repetitions. Repeat __________ times. Complete this exercise __________ times a day. Straight leg raises, supine This exercise strengthens the muscles in the front of your thigh (quadriceps and hip flexors). Lie on your back (supine position) with your left / right leg extended and your other knee bent. Tense the muscles in the front of your left / right thigh. You should see your kneecap slide up or see increased dimpling just above your knee. Keep these muscles tight as you raise your leg 4-6 inches (10-15 cm) off the floor. Do not let your knee bend. Hold this position for __________ seconds. Keep these muscles tense as you lower your leg. Relax the muscles slowly and completely between repetitions. Repeat __________ times. Complete this exercise __________ times a day. Hip abductors, standing This exercise strengthens the muscles that move the leg and hip joint away from the center of the body (hip abductors). Tie one end of a rubber exercise band or tubing to a secure surface, such as a chair, table, or pole. Loop the other end of the band or tubing around your left / right ankle. Keeping your ankle with the band or tubing directly opposite the secured end, step away until there is tension in the tubing or band. Hold on to a chair, table, or pole as needed for balance. Lift your left / right leg out to your side. While you do this: Keep your back upright. Keep your shoulders over your hips. Keep your toes pointing forward. Make sure to use your hip muscles to slowly lift your leg. Do not tip your body or forcefully lift your leg. Hold this position for __________ seconds. Slowly return to the starting position. Repeat __________ times.  Complete this exercise __________ times a day. Squats This exercise strengthens the muscles in the front of your thigh (quadriceps). Stand in front of a table, or stand in a doorframe so your feet and knees are in line with the frame. You may place your hands on the table or frame for balance. Slowly bend your knees and lower your hips like you are going to sit in a chair. Keep your lower legs in a straight up-and-down position. Do not let your hips go lower than your knees. Do not bend your knees lower than told by your provider. If your hip pain increases, do not bend as low. Hold this position for ___________ seconds. Slowly push with your legs to return to standing. Do not use your hands to pull yourself to standing. Repeat __________ times.  Complete this exercise __________ times a day. This information is not intended to replace advice given to you by your health care provider. Make sure you discuss any questions you have with your health care provider. Document Revised: 12/28/2021 Document Reviewed: 12/28/2021 Elsevier Patient Education  2023 Elsevier Inc.    If you have been instructed to have an in-person evaluation today at a local Urgent Care facility, please use the link below. It will take you to a list of all of our available [facility] [place] [facility], including address, phone number and hours of operation. Please do not delay care.  [facility]  If you or a family member do not have a primary care provider, use the link below to schedule a visit and establish care. When you choose a [facility] primary care physician or advanced practice provider, you gain a long-term partner in health. Find a Primary Care Provider  Learn more about [facility] in-office and virtual care options:  - Get Care Now

## 2022-09-12 NOTE — Progress Notes (Signed)
Virtual Visit Consent   Robin Pineda, you are scheduled for a virtual visit with a [facility] provider today. Just as with appointments in the office, your consent must be obtained to participate. Your consent will be active for this visit and any virtual visit you may have with one of our providers in the next 365 days. If you have a MyChart account, a copy of this consent can be sent to you electronically.  As this is a virtual visit, video technology does not allow for your provider to perform a traditional examination. This may limit your provider's ability to fully assess your condition. If your provider identifies any concerns that need to be evaluated in person or the need to arrange testing (such as labs, EKG, etc.), we will make arrangements to do so. Although advances in technology are sophisticated, we cannot ensure that it will always work on either your end or our end. If the connection with a video visit is poor, the visit may have to be switched to a telephone visit. With either a video or telephone visit, we are not always able to ensure that we have a secure connection.  By engaging in this virtual visit, you consent to the provision of healthcare and authorize for your insurance to be billed (if applicable) for the services provided during this visit. Depending on your insurance coverage, you may receive a charge related to this service.  I need to obtain your verbal consent now. Are you willing to proceed with your visit today? OLA [name] has provided verbal consent on 09/12/2022 for a virtual visit (video or telephone). Margaretann Loveless, PA-C  Date: 09/12/2022 12:06 PM  Virtual Visit via Video Note   I, Margaretann Loveless, connected with  Robin Pineda  ([id number], 12/22/2000) on 09/12/22 at 12:00 PM EDT by a video-enabled telemedicine application and verified that I am speaking with the correct person using two identifiers.  Location: Patient: Virtual Visit  Location Patient: Home Provider: Virtual Visit Location Provider: Home Office   I discussed the limitations of evaluation and management by telemedicine and the availability of in person appointments. The patient expressed understanding and agreed to proceed.    History of Present Illness: Robin Pineda is a 22 y.o. who identifies as a female who was assigned female at birth, and is being seen today for hip pain.  HPI: Hip Pain  The incident occurred more than 1 week ago. There was no injury mechanism. The pain is present in the right hip and left hip. The quality of the pain is described as aching and cramping. The pain is moderate. The pain has been Intermittent since onset. Pertinent negatives include no inability to bear weight, loss of motion, loss of sensation, muscle weakness, numbness or tingling. She reports no foreign bodies present. The symptoms are aggravated by movement and palpation. She has tried nothing for the symptoms. The treatment provided no relief.     Problems:  Patient Active Problem List   Diagnosis Date Noted   MDD (major depressive disorder), recurrent, severe, 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   Migraine without aura and without status migrainosus, not intractable 03/04/2014   Episodic tension-type headache 03/04/2014   Obesity 03/04/2014   Disequilibrium 03/04/2014    Allergies:  Allergies  Allergen Reactions   Cinnamon Hives   Cinnamon Swelling   Medications:  Current Outpatient Medications:    albuterol (VENTOLIN HFA) 108 (90 Base) MCG/ACT inhaler, Inhale 1-2 puffs into the lungs every  6 (six) hours as needed., Disp: 8 g, Rfl: 0   amoxicillin-clavulanate (AUGMENTIN) 875-125 MG tablet, Take 1 tablet by mouth 2 (two) times daily., Disp: 14 tablet, Rfl: 0   meloxicam (MOBIC) 15 MG tablet, Take 1 tablet (15 mg total) by mouth daily., Disp: 15 tablet, Rfl: 0   promethazine-dextromethorphan (PROMETHAZINE-DM) 6.25-15 MG/5ML syrup, Take 5 mLs by mouth 4 (four)  times daily as needed., Disp: 118 mL, Rfl: 0  Observations/Objective: Patient is well-developed, well-nourished in no acute distress.  Resting comfortably  at home.  Head is normocephalic, atraumatic.  No labored breathing.  Speech is clear and coherent with logical content.  Patient is alert and oriented at baseline.    Assessment and Plan: 1. Bilateral hip pain  - Patient with chronic type hip pain - Desires referral to chiropractor over medications; advised we are unable to provide referrals at this time due to lack of resources to move referral forward - Advised could seek in person evaluation at a local Orthopedic Urgent Care or with her PCP for the referral.   Follow Up Instructions: I discussed the assessment and treatment plan with the patient. The patient was provided an opportunity to ask questions and all were answered. The patient agreed with the plan and demonstrated an understanding of the instructions.  A copy of instructions were sent to the patient via MyChart unless otherwise noted below.    The patient was advised to call back or seek an in-person evaluation if the symptoms worsen or if the condition fails to improve as anticipated.  Time:  I spent 8 minutes with the patient via telehealth technology discussing the above problems/concerns.    Margaretann Loveless, PA-C

## 2022-09-20 ENCOUNTER — Other Ambulatory Visit: Payer: Self-pay

## 2022-09-20 ENCOUNTER — Encounter (HOSPITAL_BASED_OUTPATIENT_CLINIC_OR_DEPARTMENT_OTHER): Payer: Self-pay

## 2022-09-20 ENCOUNTER — Emergency Department (HOSPITAL_BASED_OUTPATIENT_CLINIC_OR_DEPARTMENT_OTHER)
Admission: EM | Admit: 2022-09-20 | Discharge: 2022-09-20 | Discharge status: Against Medical Advice | Payer: Medicaid Other | Attending: Emergency Medicine | Admitting: Emergency Medicine

## 2022-09-20 DIAGNOSIS — M25552 Pain in left hip: Secondary | ICD-10-CM | POA: Insufficient documentation

## 2022-09-20 DIAGNOSIS — Z5321 Procedure and treatment not carried out due to patient leaving prior to being seen by health care provider: Secondary | ICD-10-CM | POA: Insufficient documentation

## 2022-09-20 NOTE — ED Triage Notes (Addendum)
Pt reports left hip pain x 3 weeks but today the pain started radiating down her left leg, described as sharp and shooting. Denies injury. Pt ambulatory with independent steady gait.

## 2022-11-24 ENCOUNTER — Ambulatory Visit: Payer: Medicaid Other

## 2022-11-24 ENCOUNTER — Ambulatory Visit: Payer: MEDICAID

## 2022-11-24 ENCOUNTER — Inpatient Hospital Stay (HOSPITAL_COMMUNITY)
Admission: AD | Admit: 2022-11-24 | Discharge: 2022-11-24 | Disposition: A | Discharge status: Home / Self Care | Payer: MEDICAID | Attending: Obstetrics and Gynecology | Admitting: Obstetrics and Gynecology

## 2022-11-24 ENCOUNTER — Encounter (HOSPITAL_COMMUNITY): Payer: Self-pay | Admitting: Obstetrics and Gynecology

## 2022-11-24 ENCOUNTER — Ambulatory Visit
Admission: EM | Admit: 2022-11-24 | Discharge: 2022-11-24 | Disposition: A | Discharge status: Home / Self Care | Payer: MEDICAID

## 2022-11-24 ENCOUNTER — Inpatient Hospital Stay (HOSPITAL_COMMUNITY): Payer: MEDICAID

## 2022-11-24 DIAGNOSIS — Z3A01 Less than 8 weeks gestation of pregnancy: Secondary | ICD-10-CM | POA: Insufficient documentation

## 2022-11-24 DIAGNOSIS — O26891 Other specified pregnancy related conditions, first trimester: Secondary | ICD-10-CM | POA: Diagnosis not present

## 2022-11-24 DIAGNOSIS — R10A0 Flank pain, unspecified side: Secondary | ICD-10-CM

## 2022-11-24 DIAGNOSIS — Z3481 Encounter for supervision of other normal pregnancy, first trimester: Secondary | ICD-10-CM | POA: Diagnosis not present

## 2022-11-24 DIAGNOSIS — Z3201 Encounter for pregnancy test, result positive: Secondary | ICD-10-CM | POA: Diagnosis not present

## 2022-11-24 DIAGNOSIS — O99891 Other specified diseases and conditions complicating pregnancy: Secondary | ICD-10-CM | POA: Insufficient documentation

## 2022-11-24 DIAGNOSIS — Z3480 Encounter for supervision of other normal pregnancy, unspecified trimester: Secondary | ICD-10-CM

## 2022-11-24 DIAGNOSIS — R109 Unspecified abdominal pain: Secondary | ICD-10-CM | POA: Diagnosis not present

## 2022-11-24 LAB — CBC
HCT: 38.8 % (ref 36.0–46.0)
Hemoglobin: 13.1 g/dL (ref 12.0–15.0)
MCH: 29.8 pg (ref 26.0–34.0)
MCHC: 33.8 g/dL (ref 30.0–36.0)
MCV: 88.2 fL (ref 80.0–100.0)
Platelets: 200 10*3/uL (ref 150–400)
RBC: 4.4 MIL/uL (ref 3.87–5.11)
RDW: 13.2 % (ref 11.5–15.5)
WBC: 15.3 10*3/uL — ABNORMAL HIGH (ref 4.0–10.5)
nRBC: 0 % (ref 0.0–0.2)

## 2022-11-24 LAB — WET PREP, GENITAL
Clue Cells Wet Prep HPF POC: NONE SEEN
Sperm: NONE SEEN
Trich, Wet Prep: NONE SEEN
WBC, Wet Prep HPF POC: <10 (ref <10)
Yeast Wet Prep HPF POC: NONE SEEN

## 2022-11-24 LAB — URINALYSIS, ROUTINE W REFLEX MICROSCOPIC
Bilirubin Urine: NEGATIVE
Glucose, UA: NEGATIVE mg/dL
Ketones, ur: 20 mg/dL — AB
Nitrite: NEGATIVE
Protein, ur: 30 mg/dL — AB
Specific Gravity, Urine: 1.012 (ref 1.005–1.030)
WBC, UA: >50 WBC/hpf (ref 0–5)
pH: 6 (ref 5.0–8.0)

## 2022-11-24 LAB — ABO/RH
ABO/RH(D): AB NEG
Antibody Screen: NEGATIVE

## 2022-11-24 LAB — HCG, QUANTITATIVE, PREGNANCY: hCG, Beta Chain, Quant, S: 125863 m[IU]/mL — ABNORMAL HIGH (ref <5)

## 2022-11-24 LAB — POCT URINE PREGNANCY: Preg Test, Ur: POSITIVE — AB

## 2022-11-24 LAB — HIV ANTIBODY (ROUTINE TESTING W REFLEX): HIV Screen 4th Generation wRfx: NONREACTIVE

## 2022-11-24 MED ORDER — CYCLOBENZAPRINE HCL 10 MG PO TABS: 10.0000 mg | ORAL_TABLET | Freq: Two times a day (BID) | ORAL | 0 refills | Status: DC | PRN

## 2022-11-24 MED ORDER — ONDANSETRON 4 MG PO TBDP: 4.0000 mg | ORAL_TABLET | Freq: Three times a day (TID) | ORAL | 0 refills | Status: DC | PRN

## 2022-11-24 MED ORDER — ONDANSETRON HCL 4 MG PO TABS
4.0000 mg | ORAL_TABLET | Freq: Once | ORAL | Status: AC
  Administered 2022-11-24: 4 mg via ORAL
  Filled 2022-11-24: qty 1

## 2022-11-24 NOTE — Discharge Instructions (Signed)
Please go to the maternity assessment unit at [facility] as soon as you leave urgent care.

## 2022-11-24 NOTE — Discharge Instructions (Signed)

## 2022-11-24 NOTE — MAU Provider Note (Signed)
History     CSN: [id number]  Arrival date and time: 11/24/22 4098   Event Date/Time   First Provider Initiated Contact with Patient 11/24/22 1030      Chief Complaint  Patient presents with   Back Pain   Robin Pineda is a 22 y.o. year old G1P0 female at [redacted]w[redacted]d weeks gestation who was told to come MAU from [facility] reporting lower back pain and abdominal pain since yesterday. She describes the pain as sharp and constant. She denies any injury. She has a h/o chronic hip pain. She reports cortisone injection in LT hip in May of 2024; before she got pregnant. She reports she "thought" she had a miscarriage in 2020, but it is undocumented. This makes her concerned that she may be having a miscarriage. She tried taking Tylenol last night at 2100; none taken since then. She is scheduled to start [facility] with [facility] by video visit today. Her father, Madelaine Bhat, is present and contributing to the history taking.    OB History     Gravida  1   Para      Term      Preterm      AB      Living         SAB      IAB      Ectopic      Multiple      Live Births              Past Medical History:  Diagnosis Date   Asthma    Enlarged tonsils    Headache    POTS (postural orthostatic tachycardia syndrome)    Seasonal allergies     Past Surgical History:  Procedure Laterality Date   OTHER SURGICAL HISTORY Right 2011   Pins placed to repair break in right arm   TONSILECTOMY, ADENOIDECTOMY, BILATERAL MYRINGOTOMY AND TUBES     TONSILLECTOMY      History reviewed. No pertinent family history.  Social History   Tobacco Use   Smoking status: Former    Current packs/day: 0.00    Types: Cigarettes    Quit date: 10/2022    Years since quitting: 0.1   Smokeless tobacco: Never  Vaping Use   Vaping status: Every Day   Substances: Nicotine, THC, Flavoring  Substance Use Topics   Alcohol use: No   Drug use: Not Currently    Frequency: 3.0 times per week     Types: Marijuana, Cocaine    Comment: daily use; reports not currently using cocaine    Allergies:  Allergies  Allergen Reactions   Cinnamon Hives   Cinnamon Swelling    Medications Prior to Admission  Medication Sig Dispense Refill Last Dose   ARIPiprazole (ABILIFY) 10 MG tablet Take 10 mg by mouth every morning.   Past Week   atomoxetine (STRATTERA) 40 MG capsule Take 40 mg by mouth every morning.   Past Week   Prenatal Vit-Fe Fumarate-FA (PRENATAL MULTIVITAMIN) TABS tablet Take 1 tablet by mouth daily at 12 noon.   Past Week   albuterol (VENTOLIN HFA) 108 (90 Base) MCG/ACT inhaler Inhale 1-2 puffs into the lungs every 6 (six) hours as needed. 8 g 0    amoxicillin-clavulanate (AUGMENTIN) 875-125 MG tablet Take 1 tablet by mouth 2 (two) times daily. 14 tablet 0    meloxicam (MOBIC) 15 MG tablet Take 1 tablet (15 mg total) by mouth daily. 15 tablet 0    promethazine-dextromethorphan (PROMETHAZINE-DM)  6.25-15 MG/5ML syrup Take 5 mLs by mouth 4 (four) times daily as needed. 118 mL 0     Review of Systems  Constitutional: Negative.   HENT: Negative.    Eyes: Negative.   Respiratory: Negative.    Cardiovascular: Negative.   Gastrointestinal: Negative.   Endocrine: Negative.   Genitourinary:  Positive for pelvic pain.  Musculoskeletal:  Positive for back pain.  Skin: Negative.   Allergic/Immunologic: Negative.   Neurological: Negative.   Hematological: Negative.   Psychiatric/Behavioral: Negative.     Physical Exam   Blood pressure (!) 110/90, pulse 88, temperature 98.2 F (36.8 C), resp. rate 18, height 5\' 3"  (1.6 m), weight 78.9 kg, last menstrual period 10/04/2022, SpO2 100%.  Physical Exam Vitals and nursing note reviewed.  Constitutional:      Appearance: Normal appearance. She is obese.  Cardiovascular:     Rate and Rhythm: Normal rate.  Pulmonary:     Effort: Pulmonary effort is normal.  Abdominal:     Palpations: Abdomen is soft.  Genitourinary:     Comments: Swabs collected by RN using blind swab technique  Musculoskeletal:        General: Normal range of motion.  Skin:    General: Skin is warm and dry.  Neurological:     Mental Status: She is alert and oriented to person, place, and time.  Psychiatric:        Mood and Affect: Mood normal.        Behavior: Behavior normal.        Thought Content: Thought content normal.        Judgment: Judgment normal.    MAU Course  Procedures  MDM CCUA CBC ABO/Rh HCG Wet Prep GC/CT -- pending HIV -- pending OB < 14 wks [place] with TV  UPT results from [facility] Results for orders placed or performed during the hospital encounter of 11/24/22 (from the past 24 hour(s))  Urinalysis, Routine w reflex microscopic -Urine, Clean Catch     Status: Abnormal   Collection Time: 11/24/22  9:57 AM  Result Value Ref Range   Color, Urine YELLOW YELLOW   APPearance HAZY (A) CLEAR   Specific Gravity, Urine 1.012 1.005 - 1.030   pH 6.0 5.0 - 8.0   Glucose, UA NEGATIVE NEGATIVE mg/dL   Hgb urine dipstick MODERATE (A) NEGATIVE   Bilirubin Urine NEGATIVE NEGATIVE   Ketones, ur 20 (A) NEGATIVE mg/dL   Protein, ur 30 (A) NEGATIVE mg/dL   Nitrite NEGATIVE NEGATIVE   Leukocytes,Ua LARGE (A) NEGATIVE   RBC / HPF 0-5 0 - 5 RBC/hpf   WBC, UA >50 0 - 5 WBC/hpf   Bacteria, UA RARE (A) NONE SEEN   Squamous Epithelial / HPF 11-20 0 - 5 /HPF  ABO/Rh     Status: None   Collection Time: 11/24/22 10:20 AM  Result Value Ref Range   ABO/RH(D) AB NEG    Antibody Screen      NEG Performed at [facility], [address], [place], [place] [address]   CBC     Status: Abnormal   Collection Time: 11/24/22 10:21 AM  Result Value Ref Range   WBC 15.3 (H) 4.0 - 10.5 K/uL   RBC 4.40 3.87 - 5.11 MIL/uL   Hemoglobin 13.1 12.0 - 15.0 g/dL   HCT 82.9 56.2 - 13.0 %   MCV 88.2 80.0 - 100.0 fL   MCH 29.8 26.0 - 34.0 pg   MCHC 33.8 30.0 - 36.0  g/dL   RDW 16.1 09.6 - 04.5 %   Platelets 200 150 - 400  K/uL   nRBC 0.0 0.0 - 0.2 %  hCG, quantitative, pregnancy     Status: Abnormal   Collection Time: 11/24/22 10:21 AM  Result Value Ref Range   hCG, Beta Chain, Quant, S 125,863 (H) <5 mIU/mL  Wet prep, genital     Status: None   Collection Time: 11/24/22 10:24 AM   Specimen: Vaginal  Result Value Ref Range   Yeast Wet Prep HPF POC NONE SEEN NONE SEEN   Trich, Wet Prep NONE SEEN NONE SEEN   Clue Cells Wet Prep HPF POC NONE SEEN NONE SEEN   WBC, Wet Prep HPF POC <10 <10   Sperm NONE SEEN     US OB Comp Less 14 Wks  Result Date: 11/24/2022 CLINICAL DATA:  Right pelvic pain, pregnant EXAM: OBSTETRIC <14 WK ULTRASOUND TECHNIQUE: Transabdominal ultrasound was performed for evaluation of the gestation as well as the maternal uterus and adnexal regions. COMPARISON:  None Available. FINDINGS: Intrauterine gestational sac: Single Yolk sac:  Visualized. Embryo:  Visualized. Cardiac Activity: Visualized. Heart Rate: 133 bpm CRL:   10.3 mm   7 w 1 d                  [place] EDC: 07/12/2023 Subchorionic hemorrhage:  None visualized. Maternal uterus/adnexae: Unremarkable. IMPRESSION: Single intrauterine gestation at sonographic gestational age [redacted] weeks, 1 day. Fetal heart rate 133 beats per minute. EDD 07/12/2023. Electronically Signed   By: Jearld Lesch M.D.   On: 11/24/2022 11:27     Assessment and Plan  1. Intrauterine pregnancy in teenager - Advised to continue [facility]  2. Back pain affecting pregnancy in first trimester - Information provided on back pain in pregnancy - Rx: Flexeril 10 mg po BID prn pain   3. Abdominal pain during pregnancy in first trimester - Information provided on abdominal pain in pregnancy   4. [redacted] weeks gestation of pregnancy   - Discharge patient - Keep scheduled appt with  OB/GYN on 11/24/2022 - Patient verbalized an understanding of the plan of care and agrees.   Raelyn Mora, CNM 11/24/2022, 10:36 AM

## 2022-11-24 NOTE — ED Triage Notes (Signed)
Pt c/o back pain, onset yesterday day. Pt denies any injury to the back. pt is [redacted] weeks pregnant

## 2022-11-24 NOTE — ED Provider Notes (Signed)
[name]    CSN: [id number] Arrival date & time: 11/24/22  0850      History   Chief Complaint No chief complaint on file.   HPI Robin Pineda is a 22 y.o. female.   Patient presents with right lower back pain that started yesterday.  Reports back pain is severe and is rated 8/10 on pain scale.  Denies injury to the area.  Denies history of chronic back pain but does report that she has chronic hip pain bilaterally.  Pain does not radiate down legs.  Denies numbness or tingling.  Patient reports that she is currently about [redacted] weeks pregnant so has had some urinary frequency but denies dysuria, vaginal discharge, abnormal vaginal bleeding.  Patient reports that she had severe lower abdominal cramping yesterday that lasted all day.  Reports it is now resolved.  Denies any fever, chills, body aches.  Denies any injury to any of the areas.  Patient reports history of miscarriage so she is concerned.  Has taken Tylenol for pain.     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   Migraine without aura and without status migrainosus, not intractable 03/04/2014   Episodic tension-type headache 03/04/2014   Obesity 03/04/2014   Disequilibrium 03/04/2014    Past Surgical History:  Procedure Laterality Date   OTHER SURGICAL HISTORY Right 2011   Pins placed to repair break   TONSILECTOMY, ADENOIDECTOMY, BILATERAL MYRINGOTOMY AND TUBES     TONSILLECTOMY      OB History     Gravida  1   Para      Term      Preterm      AB      Living         SAB      IAB      Ectopic      Multiple      Live Births               Home Medications    Prior to Admission medications   Medication Sig Start  Date End Date Taking? Authorizing Provider  ARIPiprazole (ABILIFY) 10 MG tablet Take 10 mg by mouth every morning. 11/11/22  Yes [provider]  atomoxetine (STRATTERA) 40 MG capsule Take 40 mg by mouth every morning. 11/11/22  Yes [provider]  Prenatal Vit-Fe Fumarate-FA (PRENATAL MULTIVITAMIN) TABS tablet Take 1 tablet by mouth daily at 12 noon.   Yes [provider]  albuterol (VENTOLIN HFA) 108 (90 Base) MCG/ACT inhaler Inhale 1-2 puffs into the lungs every 6 (six) hours as needed. 09/08/22   Margaretann Loveless, PA-C  amoxicillin-clavulanate (AUGMENTIN) 875-125 MG tablet Take 1 tablet by mouth 2 (two) times daily. 09/08/22   Margaretann Loveless, PA-C  meloxicam (MOBIC) 15 MG tablet Take 1 tablet (15 mg total) by mouth daily. 07/22/22   Waldon Merl, PA-C  promethazine-dextromethorphan (PROMETHAZINE-DM) 6.25-15 MG/5ML syrup Take 5 mLs by mouth 4 (four) times daily as needed. 09/08/22   Margaretann Loveless, PA-C  atenolol (TENORMIN) 25 MG tablet Take 25 mg by mouth daily. 08/20/18 02/18/19  [provider]  FLUoxetine (PROZAC) 20 MG capsule Take 1 capsule (20 mg total) by  mouth daily. 11/14/17 02/18/19  Denzil Magnuson, NP  fluticasone (FLONASE) 50 MCG/ACT nasal spray Place 1 spray into both nostrils daily.  12/29/19  [provider]  Norethindrone Acetate-Ethinyl Estrad-FE (LOESTRIN 24 FE) 1-20 MG-MCG(24) tablet Take 1 tablet by mouth daily. Patient not taking: Reported on 09/05/2018 10/04/17 02/18/19  Sharyon Cable, CNM    Family History History reviewed. No pertinent family history.  Social History Social History   Tobacco Use   Smoking status: Some Days    Types: Cigarettes   Smokeless tobacco: Never  Vaping Use   Vaping status: Some Days   Substances: Nicotine, THC, Flavoring  Substance Use Topics   Alcohol use: No   Drug use: Not Currently    Frequency: 3.0 times per week    Types: Marijuana, Cocaine    Comment: daily use;  reports not currently using cocaine     Allergies   Cinnamon and Cinnamon   Review of Systems Review of Systems Per HPI  Physical Exam Triage Vital Signs ED Triage Vitals  Encounter Vitals Group     BP 11/24/22 0855 100/69     Systolic BP Percentile --      Diastolic BP Percentile --      Pulse Rate 11/24/22 0855 84     Resp 11/24/22 0855 13     Temp 11/24/22 0855 98.3 F (36.8 C)     Temp src --      SpO2 11/24/22 0855 97 %     Weight --      Height --      Head Circumference --      Peak Flow --      Pain Score 11/24/22 0858 8     Pain Loc --      Pain Education --      Exclude from Growth Chart --    No data found.  Updated Vital Signs BP 100/69 (BP Location: Left Arm)   Pulse 84   Temp 98.3 F (36.8 C)   Resp 13   LMP 08/31/2022 (Approximate)   SpO2 97%   Visual Acuity Right Eye Distance:   Left Eye Distance:   Bilateral Distance:    Right Eye Near:   Left Eye Near:    Bilateral Near:     Physical Exam Constitutional:      General: She is not in acute distress.    Appearance: Normal appearance. She is not toxic-appearing or diaphoretic.  HENT:     Head: Normocephalic and atraumatic.  Eyes:     Extraocular Movements: Extraocular movements intact.     Conjunctiva/sclera: Conjunctivae normal.  Pulmonary:     Effort: Pulmonary effort is normal.  Abdominal:     General: There is no distension.     Palpations: Abdomen is soft.     Tenderness: There is no abdominal tenderness.  Musculoskeletal:     Comments: Patient is significantly tender to palpation to right lower portion of back.  There is no direct spinal tenderness, crepitus, step-off, swelling, discoloration noted.  Movement exacerbates pain.  Neurological:     General: No focal deficit present.     Mental Status: She is alert and oriented to person, place, and time. Mental status is at baseline.     Deep Tendon Reflexes: Reflexes are normal and symmetric.  Psychiatric:        Mood and  Affect: Mood normal.        Behavior: Behavior normal.        Thought  Content: Thought content normal.        Judgment: Judgment normal.      UC Treatments / Results  Labs (all labs ordered are listed, but only abnormal results are displayed) Labs Reviewed  POCT URINE PREGNANCY - Abnormal; Notable for the following components:      Result Value   Preg Test, Ur Positive (*)    All other components within normal limits    EKG   Radiology No results found.  Procedures Procedures (including critical care time)  Medications Ordered in UC Medications - No data to display  Initial Impression / Assessment and Plan / UC Course  I have reviewed the triage vital signs and the nursing notes.  Pertinent labs & imaging results that were available during my care of the patient were reviewed by me and considered in my medical decision making (see chart for details).     I am suspicious that patient's right lower back pain could be muscular in nature but with patient being currently pregnant and having severe abdominal pain yesterday, recommend that she get further evaluation by the MAU today.  Patient was advised to go to the maternity assessment unit at [facility] today for further evaluation.  She was agreeable with this plan.  Urine pregnancy confirmed today in urgent care.  Vital signs stable at discharge.   Agree with patient self transport to the ER. Final Clinical Impressions(s) / UC Diagnoses   Final diagnoses:  Flank pain  Positive urine pregnancy test     Discharge Instructions      Please go to the maternity assessment unit at [facility] as soon as you leave urgent care.    ED Prescriptions   None    PDMP not reviewed this encounter.   Gustavus Bryant, [place] 11/24/22 [phone]

## 2022-11-24 NOTE — MAU Note (Signed)
.  Robin Pineda is a 22 y.o. at [redacted]w[redacted]d here in MAU reporting: Stared having some ab pain yesterday. Today pain I in her back on the right lower back that  is sharp an constant.  LMP: 10/04/22 Onset of complaint: yesterday Pain score: 8 Vitals:   11/24/22 0957  BP: 130/69  Pulse: 77  Resp: 18  Temp: 98.2 F (36.8 C)     FHT:n/a Lab orders placed from triage:   u/a

## 2022-11-24 NOTE — ED Notes (Signed)
Patient is being discharged from the Urgent Care and sent to the MAU via private vehicle . Per Laren Everts, NP, patient is in need of higher level of care due to abd pain with +pregnancy. Patient is aware and verbalizes understanding of plan of care.  Vitals:   11/24/22 0855  BP: 100/69  Pulse: 84  Resp: 13  Temp: 98.3 F (36.8 C)  SpO2: 97%

## 2022-11-25 ENCOUNTER — Ambulatory Visit (INDEPENDENT_AMBULATORY_CARE_PROVIDER_SITE_OTHER): Payer: MEDICAID

## 2022-11-25 ENCOUNTER — Telehealth: Payer: Self-pay | Admitting: Obstetrics and Gynecology

## 2022-11-25 DIAGNOSIS — Z3689 Encounter for other specified antenatal screening: Secondary | ICD-10-CM

## 2022-11-25 DIAGNOSIS — Z3480 Encounter for supervision of other normal pregnancy, unspecified trimester: Secondary | ICD-10-CM

## 2022-11-25 DIAGNOSIS — Z3490 Encounter for supervision of normal pregnancy, unspecified, unspecified trimester: Secondary | ICD-10-CM | POA: Insufficient documentation

## 2022-11-25 HISTORY — DX: Encounter for supervision of normal pregnancy, unspecified, unspecified trimester: Z34.90

## 2022-11-25 LAB — GC/CHLAMYDIA PROBE AMP (~~LOC~~) NOT AT ARMC
Chlamydia: NEGATIVE
Comment: NEGATIVE
Comment: NORMAL
Neisseria Gonorrhea: NEGATIVE

## 2022-11-25 NOTE — Telephone Encounter (Signed)
Pt was rescheduled for her [facility] Nurse Intake appt that was scheduled for 11/24/2022 at 1:15.  Pt has been rescheduled to 11/25/2022 at 2:15.

## 2022-11-25 NOTE — Patient Instructions (Signed)
First Trimester of Pregnancy  The first trimester of pregnancy starts on the first day of your last menstrual period until the end of week 12. This is also called months 1 through 3 of pregnancy. Body changes during your first trimester Your body goes through many changes during pregnancy. The changes usually return to normal after your baby is born. Physical changes You may gain or lose weight. Your breasts may grow larger and hurt. The area around your nipples may get darker. Dark spots or blotches may develop on your face. You may have changes in your hair. Health changes You may feel like you might vomit (nauseous), and you may vomit. You may have heartburn. You may have headaches. You may have trouble pooping (constipation). Your gums may bleed. Other changes You may get tired easily. You may pee (urinate) more often. Your menstrual periods will stop. You may not feel hungry. You may want to eat certain kinds of food. You may have changes in your emotions from day to day. You may have more dreams. Follow these instructions at home: Medicines Take over-the-counter and prescription medicines only as told by your doctor. Some medicines are not safe during pregnancy. Take a prenatal vitamin that contains at least 600 micrograms (mcg) of folic acid. Eating and drinking Eat healthy meals that include: Fresh fruits and vegetables. Whole grains. Good sources of protein, such as meat, eggs, or tofu. Low-fat dairy products. Avoid raw meat and unpasteurized juice, milk, and cheese. If you feel like you may vomit, or you vomit: Eat 4 or 5 small meals a day instead of 3 large meals. Try eating a few soda crackers. Drink liquids between meals instead of during meals. You may need to take these actions to prevent or treat trouble pooping: Drink enough fluids to keep your pee (urine) pale yellow. Eat foods that are high in fiber. These include beans, whole grains, and fresh fruits and  vegetables. Limit foods that are high in fat and sugar. These include fried or sweet foods. Activity Exercise only as told by your doctor. Most people can do their usual exercise routine during pregnancy. Stop exercising if you have cramps or pain in your lower belly (abdomen) or low back. Do not exercise if it is too hot or too humid, or if you are in a place of great height (high altitude). Avoid heavy lifting. If you choose to, you may have sex unless your doctor tells you not to. Relieving pain and discomfort Wear a good support bra if your breasts are sore. Rest with your legs raised (elevated) if you have leg cramps or low back pain. If you have bulging veins (varicose veins) in your legs: Wear support hose as told by your doctor. Raise your feet for 15 minutes, 3-4 times a day. Limit salt in your food. Safety Wear your seat belt at all times when you are in a car. Talk with your doctor if someone is hurting you or yelling at you. Talk with your doctor if you are feeling sad or have thoughts of hurting yourself. Lifestyle Do not use hot tubs, steam rooms, or saunas. Do not douche. Do not use tampons or scented sanitary pads. Do not use herbal medicines, illegal drugs, or medicines that are not approved by your doctor. Do not drink alcohol. Do not smoke or use any products that contain nicotine or tobacco. If you need help quitting, ask your doctor. Avoid cat litter boxes and soil that is used by cats. These carry   germs that can cause harm to the baby and can cause a loss of your baby by miscarriage or stillbirth. General instructions Keep all follow-up visits. This is important. Ask for help if you need counseling or if you need help with nutrition. Your doctor can give you advice or tell you where to go for help. Visit your dentist. At home, brush your teeth with a soft toothbrush. Floss gently. Write down your questions. Take them to your prenatal visits. Where to find more  information American Pregnancy Association: americanpregnancy.org American College of Obstetricians and Gynecologists: www.acog.org Office on Women's Health: womenshealth.gov/pregnancy Contact a doctor if: You are dizzy. You have a fever. You have mild cramps or pressure in your lower belly. You have a nagging pain in your belly area. You continue to feel like you may vomit, you vomit, or you have watery poop (diarrhea) for 24 hours or longer. You have a bad-smelling fluid coming from your vagina. You have pain when you pee. You are exposed to a disease that spreads from person to person, such as chickenpox, measles, Zika virus, HIV, or hepatitis. Get help right away if: You have spotting or bleeding from your vagina. You have very bad belly cramping or pain. You have shortness of breath or chest pain. You have any kind of injury, such as from a fall or a car crash. You have new or increased pain, swelling, or redness in an arm or leg. Summary The first trimester of pregnancy starts on the first day of your last menstrual period until the end of week 12 (months 1 through 3). Eat 4 or 5 small meals a day instead of 3 large meals. Do not smoke or use any products that contain nicotine or tobacco. If you need help quitting, ask your doctor. Keep all follow-up visits. This information is not intended to replace advice given to you by your health care provider. Make sure you discuss any questions you have with your health care provider. Document Revised: 10/02/2019 Document Reviewed: 08/08/2019 Elsevier Patient Education  2024 [employer]. Commonly Asked Questions During Pregnancy  Cats: A parasite can be excreted in cat feces.  To avoid exposure you need to have another person empty the little box.  If you must empty the litter box you will need to wear gloves.  Wash your hands after handling your cat.  This parasite can also be found in raw or undercooked meat so this should also be  avoided.  Colds, Sore Throats, Flu: Please check your medication sheet to see what you can take for symptoms.  If your symptoms are unrelieved by these medications please call the office.  Dental Work: Most any dental work your dentist recommends is permitted.  X-rays should only be taken during the first trimester if absolutely necessary.  Your abdomen should be shielded with a lead apron during all x-rays.  Please notify your provider prior to receiving any x-rays.  Novocaine is fine; gas is not recommended.  If your dentist requires a note from us prior to dental work please call the office and we will provide one for you.  Exercise: Exercise is an important part of staying healthy during your pregnancy.  You may continue most exercises you were accustomed to prior to pregnancy.  Later in your pregnancy you will most likely notice you have difficulty with activities requiring balance like riding a bicycle.  It is important that you listen to your body and avoid activities that put you at a higher   risk of falling.  Adequate rest and staying well hydrated are a must!  If you have questions about the safety of specific activities ask your provider.    Exposure to Children with illness: Try to avoid obvious exposure; report any symptoms to us when noted,  If you have chicken pos, red measles or mumps, you should be immune to these diseases.   Please do not take any vaccines while pregnant unless you have checked with your OB provider.  Fetal Movement: After 28 weeks we recommend you do "kick counts" twice daily.  Lie or sit down in a calm quiet environment and count your baby movements "kicks".  You should feel your baby at least 10 times per hour.  If you have not felt 10 kicks within the first hour get up, walk around and have something sweet to eat or drink then repeat for an additional hour.  If count remains less than 10 per hour notify your provider.  Fumigating: Follow your pest control agent's  advice as to how long to stay out of your home.  Ventilate the area well before re-entering.  Hemorrhoids:   Most over-the-counter preparations can be used during pregnancy.  Check your medication to see what is safe to use.  It is important to use a stool softener or fiber in your diet and to drink lots of liquids.  If hemorrhoids seem to be getting worse please call the office.   Hot Tubs:  Hot tubs Jacuzzis and saunas are not recommended while pregnant.  These increase your internal body temperature and should be avoided.  Intercourse:  Sexual intercourse is safe during pregnancy as long as you are comfortable, unless otherwise advised by your provider.  Spotting may occur after intercourse; report any bright red bleeding that is heavier than spotting.  Labor:  If you know that you are in labor, please go to the hospital.  If you are unsure, please call the office and let us help you decide what to do.  Lifting, straining, etc:  If your job requires heavy lifting or straining please check with your provider for any limitations.  Generally, you should not lift items heavier than that you can lift simply with your hands and arms (no back muscles)  Painting:  Paint fumes do not harm your pregnancy, but may make you ill and should be avoided if possible.  Latex or water based paints have less odor than oils.  Use adequate ventilation while painting.  Permanents & Hair Color:  Chemicals in hair dyes are not recommended as they cause increase hair dryness which can increase hair loss during pregnancy.  " Highlighting" and permanents are allowed.  Dye may be absorbed differently and permanents may not hold as well during pregnancy.  Sunbathing:  Use a sunscreen, as skin burns easily during pregnancy.  Drink plenty of fluids; avoid over heating.  Tanning Beds:  Because their possible side effects are still unknown, tanning beds are not recommended.  Ultrasound Scans:  Routine ultrasounds are performed  at approximately 20 weeks.  You will be able to see your baby's general anatomy an if you would like to know the gender this can usually be determined as well.  If it is questionable when you conceived you may also receive an ultrasound early in your pregnancy for dating purposes.  Otherwise ultrasound exams are not routinely performed unless there is a medical necessity.  Although you can request a scan we ask that you pay for it when   conducted because insurance does not cover " patient request" scans.  Work: If your pregnancy proceeds without complications you may work until your due date, unless your physician or employer advises otherwise.  Round Ligament Pain/Pelvic Discomfort:  Sharp, shooting pains not associated with bleeding are fairly common, usually occurring in the second trimester of pregnancy.  They tend to be worse when standing up or when you remain standing for long periods of time.  These are the result of pressure of certain pelvic ligaments called "round ligaments".  Rest, Tylenol and heat seem to be the most effective relief.  As the womb and fetus grow, they rise out of the pelvis and the discomfort improves.  Please notify the office if your pain seems different than that described.  It may represent a more serious condition.  Common Medications Safe in Pregnancy  Acne:      Constipation:  Benzoyl Peroxide     Colace  Clindamycin      Dulcolax Suppository  Topica Erythromycin     Fibercon  Salicylic Acid      Metamucil         Miralax AVOID:        Senakot   Accutane    Cough:  Retin-A       Cough Drops  Tetracycline      Phenergan w/ Codeine if Rx  Minocycline      Robitussin (Plain & DM)  Antibiotics:     Crabs/Lice:  Ceclor       RID  Cephalosporins    AVOID:  E-Mycins      Kwell  Keflex  Macrobid/Macrodantin   Diarrhea:  Penicillin      Kao-Pectate  Zithromax      Imodium AD         PUSH FLUIDS AVOID:       Cipro     Fever:  Tetracycline      Tylenol (Regular  or Extra  Minocycline       Strength)  Levaquin      Extra Strength-Do not          Exceed 8 tabs/24 hrs Caffeine:        <200mg/day (equiv. To 1 cup of coffee or  approx. 3 12 oz sodas)         Gas: Cold/Hayfever:       Gas-X  Benadryl      Mylicon  Claritin       Phazyme  **Claritin-D        Chlor-Trimeton    Headaches:  Dimetapp      ASA-Free Excedrin  Drixoral-Non-Drowsy     Cold Compress  Mucinex (Guaifenasin)     Tylenol (Regular or Extra  Sudafed/Sudafed-12 Hour     Strength)  **Sudafed PE Pseudoephedrine   Tylenol Cold & Sinus     Vicks Vapor Rub  Zyrtec  **AVOID if Problems With Blood Pressure         Heartburn: Avoid lying down for at least 1 hour after meals  Aciphex      Maalox     Rash:  Milk of Magnesia     Benadryl    Mylanta       1% Hydrocortisone Cream  Pepcid  Pepcid Complete   Sleep Aids:  Prevacid      Ambien   Prilosec       Benadryl  Rolaids       Chamomile Tea  Tums (Limit 4/day)     Unisom           Tylenol PM         Warm milk-add vanilla or  Hemorrhoids:       Sugar for taste  Anusol/Anusol H.C.  (RX: Analapram 2.5%)  Sugar Substitutes:  Hydrocortisone OTC     Ok in moderation  Preparation H      Tucks        Vaseline lotion applied to tissue with wiping    Herpes:     Throat:  Acyclovir      Oragel  Famvir  Valtrex     Vaccines:         Flu Shot Leg Cramps:       *Gardasil  Benadryl      Hepatitis A         Hepatitis B Nasal Spray:       Pneumovax  Saline Nasal Spray     Polio Booster         Tetanus Nausea:       Tuberculosis test or PPD  Vitamin B6 25 mg TID   AVOID:    Dramamine      *Gardasil  Emetrol       Live Poliovirus  Ginger Root 250 mg QID    MMR (measles, mumps &  High Complex Carbs @ Bedtime    rebella)  Sea Bands-Accupressure    Varicella (Chickenpox)  Unisom 1/2 tab TID     *No known complications           If received before Pain:         Known pregnancy;   Darvocet       Resume series  after  Lortab        Delivery  Percocet    Yeast:   Tramadol      Femstat  Tylenol 3      Gyne-lotrimin  Ultram       Monistat  Vicodin           MISC:         All Sunscreens           Hair Coloring/highlights          Insect Repellant's          (Including DEET)         Mystic Tans  

## 2022-11-25 NOTE — Progress Notes (Signed)
New OB Intake  I connected with  Robin Pineda on 11/25/22 at  2:15 PM EDT by telephone  and verified that I am speaking with the correct person using two identifiers. Nurse is located at [facility] and pt is located at home.  I explained I am completing New OB Intake today. We discussed her EDD of 07/11/2023 that is based on LMP of 10/04/2022. Pt is G1/P0. I reviewed her allergies, medications, Medical/Surgical/OB history, and appropriate screenings. There are cats in the home. Pt does not change the litter box.    Indoor Based on history, this is a/an pregnancy uncomplicated .   Patient Active Problem List   Diagnosis Date Noted   Supervision of other normal pregnancy, antepartum 11/25/2022   MDD (major depressive disorder), recurrent, severe, 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   Migraine without aura and without status migrainosus, not intractable 03/04/2014   Episodic tension-type headache 03/04/2014   Obesity 03/04/2014   Disequilibrium 03/04/2014    Concerns addressed today None   Delivery Plans:  Plans to deliver at [facility].  Anatomy US Explained first [place] was done yesterday showing [redacted]w[redacted]d consistent with pt's LMP; an anatomy scan will be done at 20 weeks.   Labs Discussed genetic screening with patient. Patient declines genetic testing to be drawn at new OB visit. Discussed possible labs to be drawn at new OB appointment.  COVID Vaccine Patient has not had COVID vaccine.   Social Determinants of Health Food Insecurity: expresses food insecurity. Information given on local food banks.  Transportation: Patient denies transportation needs. Childcare: Discussed no children allowed at ultrasound appointments.   First visit review I reviewed new OB appt with pt. I explained she will have ob bloodwork and pap smear/pelvic exam if indicated. Explained pt will be seen by an AOB provider at first visit; encounter routed to appropriate provider.   Loran Senters, [facility] 11/25/2022  2:41 PM

## 2022-11-28 NOTE — Progress Notes (Signed)
Patient is scheduled to 01/02/2023 at 9:55 with Shanda Bumps

## 2023-01-02 ENCOUNTER — Ambulatory Visit (INDEPENDENT_AMBULATORY_CARE_PROVIDER_SITE_OTHER): Payer: MEDICAID | Admitting: Certified Nurse Midwife

## 2023-01-02 VITALS — BP 112/71 | HR 74 | Ht 63.0 in | Wt 163.1 lb

## 2023-01-02 DIAGNOSIS — Z113 Encounter for screening for infections with a predominantly sexual mode of transmission: Secondary | ICD-10-CM

## 2023-01-02 DIAGNOSIS — Z114 Encounter for screening for human immunodeficiency virus [HIV]: Secondary | ICD-10-CM

## 2023-01-02 DIAGNOSIS — Z3480 Encounter for supervision of other normal pregnancy, unspecified trimester: Secondary | ICD-10-CM | POA: Diagnosis not present

## 2023-01-02 DIAGNOSIS — Z369 Encounter for antenatal screening, unspecified: Secondary | ICD-10-CM

## 2023-01-02 DIAGNOSIS — Z1379 Encounter for other screening for genetic and chromosomal anomalies: Secondary | ICD-10-CM

## 2023-01-02 DIAGNOSIS — Z3A12 12 weeks gestation of pregnancy: Secondary | ICD-10-CM

## 2023-01-02 DIAGNOSIS — F319 Bipolar disorder, unspecified: Secondary | ICD-10-CM

## 2023-01-02 MED ORDER — ONDANSETRON 4 MG PO TBDP: 4.0000 mg | ORAL_TABLET | Freq: Three times a day (TID) | ORAL | 3 refills | Status: DC | PRN

## 2023-01-02 MED ORDER — PROMETHAZINE HCL 12.5 MG PO TABS: 12.5000 mg | ORAL_TABLET | Freq: Four times a day (QID) | ORAL | 3 refills | Status: DC | PRN

## 2023-01-02 MED ORDER — PRENATE MINI 18-0.6-0.4-350 MG PO CAPS: 1.0000 | ORAL_CAPSULE | Freq: Every day | ORAL | 4 refills | Status: DC

## 2023-01-02 MED ORDER — ASPIRIN 81 MG PO TBEC: 81.0000 mg | DELAYED_RELEASE_TABLET | Freq: Every day | ORAL | 2 refills | Status: DC

## 2023-01-02 MED ORDER — NICOTINE 14 MG/24HR TD PT24: 14.0000 mg | MEDICATED_PATCH | Freq: Every day | TRANSDERMAL | 0 refills | Status: AC

## 2023-01-02 NOTE — Patient Instructions (Signed)
 First Trimester of Pregnancy  The first trimester of pregnancy starts on the first day of your last menstrual period until the end of week 12. This is months 1 through 3 of pregnancy. A week after a sperm fertilizes an egg, the egg will implant into the wall of the uterus and begin to develop into a baby. By the end of 12 weeks, all the baby's organs will be formed and the baby will be 2-3 inches in size. Body changes during your first trimester Your body goes through many changes during pregnancy. The changes vary and generally return to normal after your baby is born. Physical changes You may gain or lose weight. Your breasts may begin to grow larger and become tender. The tissue that surrounds your nipples (areola) may become darker. Dark spots or blotches (chloasma or mask of pregnancy) may develop on your face. You may have changes in your hair. These can include thickening or thinning of your hair or changes in texture. Health changes You may feel nauseous, and you may vomit. You may have heartburn. You may develop headaches. You may develop constipation. Your gums may bleed and may be sensitive to brushing and flossing. Other changes You may tire easily. You may urinate more often. Your menstrual periods will stop. You may have a loss of appetite. You may develop cravings for certain kinds of food. You may have changes in your emotions from day to day. You may have more vivid and strange dreams. Follow these instructions at home: Medicines Follow your health care provider's instructions regarding medicine use. Specific medicines may be either safe or unsafe to take during pregnancy. Do not take any medicines unless told to by your health care provider. Take a prenatal vitamin that contains at least 600 micrograms (mcg) of folic acid. Eating and drinking Eat a healthy diet that includes fresh fruits and vegetables, whole grains, good sources of protein such as meat, eggs, or tofu,  and low-fat dairy products. Avoid raw meat and unpasteurized juice, milk, and cheese. These carry germs that can harm you and your baby. If you feel nauseous or you vomit: Eat 4 or 5 small meals a day instead of 3 large meals. Try eating a few soda crackers. Drink liquids between meals instead of during meals. You may need to take these actions to prevent or treat constipation: Drink enough fluid to keep your urine pale yellow. Eat foods that are high in fiber, such as beans, whole grains, and fresh fruits and vegetables. Limit foods that are high in fat and processed sugars, such as fried or sweet foods. Activity Exercise only as directed by your health care provider. Most people can continue their usual exercise routine during pregnancy. Try to exercise for 30 minutes at least 5 days a week. Stop exercising if you develop pain or cramping in the lower abdomen or lower back. Avoid exercising if it is very hot or humid or if you are at high altitude. Avoid heavy lifting. If you choose to, you may have sex unless your health care provider tells you not to. Relieving pain and discomfort Wear a good support bra to relieve breast tenderness. Rest with your legs elevated if you have leg cramps or low back pain. If you develop bulging veins (varicose veins) in your legs: Wear support hose as told by your health care provider. Elevate your feet for 15 minutes, 3-4 times a day. Limit salt in your diet. Safety Wear your seat belt at all times when  driving or riding in a car. Talk with your health care provider if someone is verbally or physically abusive to you. Talk with your health care provider if you are feeling sad or have thoughts of hurting yourself. Lifestyle Do not use hot tubs, steam rooms, or saunas. Do not douche. Do not use tampons or scented sanitary pads. Do not use herbal remedies, alcohol, illegal drugs, or medicines that are not approved by your health care provider. Chemicals  in these products can harm your baby. Do not use any products that contain nicotine or tobacco, such as cigarettes, e-cigarettes, and chewing tobacco. If you need help quitting, ask your health care provider. Avoid cat litter boxes and soil used by cats. These carry germs that can cause birth defects in the baby and possibly loss of the unborn baby (fetus) by miscarriage or stillbirth. General instructions During routine prenatal visits in the first trimester, your health care provider will do a physical exam, perform necessary tests, and ask you how things are going. Keep all follow-up visits. This is important. Ask for help if you have counseling or nutritional needs during pregnancy. Your health care provider can offer advice or refer you to specialists for help with various needs. Schedule a dentist appointment. At home, brush your teeth with a soft toothbrush. Floss gently. Write down your questions. Take them to your prenatal visits. Where to find more information American Pregnancy Association: americanpregnancy.org [employer]ians [employer] Gynecologists: https://www.todd-brady.net/ Office on [employer] Health: MightyReward.co.nz Contact a health care provider if you have: Dizziness. A fever. Mild pelvic cramps, pelvic pressure, or nagging pain in the abdominal area. Nausea, vomiting, or diarrhea that lasts for 24 hours or longer. A bad-smelling vaginal discharge. Pain when you urinate. Known exposure to a contagious illness, such as chickenpox, measles, Zika virus, HIV, or hepatitis. Get help right away if you have: Spotting or bleeding from your vagina. Severe abdominal cramping or pain. Shortness of breath or chest pain. Any kind of trauma, such as from a fall or a car crash. New or increased pain, swelling, or redness in an arm or leg. Summary The first trimester of pregnancy starts on the first day of your last menstrual period until the end of week  12 (months 1 through 3). Eating 4 or 5 small meals a day rather than 3 large meals may help to relieve nausea and vomiting. Do not use any products that contain nicotine or tobacco, such as cigarettes, e-cigarettes, and chewing tobacco. If you need help quitting, ask your health care provider. Keep all follow-up visits. This is important. This information is not intended to replace advice given to you by your health care provider. Make sure you discuss any questions you have with your health care provider. Document Revised: 10/02/2019 Document Reviewed: 08/08/2019 Elsevier Patient Education  2024 [employer].

## 2023-01-02 NOTE — Progress Notes (Signed)
NEW OB HISTORY AND PHYSICAL  SUBJECTIVE:       Robin Pineda is a 22 y.o. G1P0 female, Patient's last menstrual period was 10/04/2022 (approximate)., Estimated Date of Delivery: 07/11/23, [redacted]w[redacted]d, presents today for establishment of Prenatal Care. This is a desired pregnancy. Feels she has lost about 10lb since becoming pregnant. She reports nausea without much relief from diclegis, zofran helps and fatigue   Social history Partner/Relationship: single, FOB involved in pregnancy but not in relationship Living situation: lives with her father Work: not employed Substance use: vaping: thc & nicotine   Gynecologic History Patient's last menstrual period was 10/04/2022 (approximate). Normal  Last Pap: n/a due to age.   Obstetric History OB History  Gravida Para Term Preterm AB Living  1            SAB IAB Ectopic Multiple Live Births               # Outcome Date GA Lbr Len/2nd Weight Sex Type Anes PTL Lv  1 Current             Past Medical History:  Diagnosis Date   Asthma    Enlarged tonsils    Headache    POTS (postural orthostatic tachycardia syndrome)    Seasonal allergies     Past Surgical History:  Procedure Laterality Date   OTHER SURGICAL HISTORY Right 2011   Pins placed to repair break in right arm   TONSILECTOMY, ADENOIDECTOMY, BILATERAL MYRINGOTOMY AND TUBES     TONSILLECTOMY      Current Outpatient Medications on File Prior to Visit  Medication Sig Dispense Refill   albuterol (VENTOLIN HFA) 108 (90 Base) MCG/ACT inhaler Inhale 1-2 puffs into the lungs every 6 (six) hours as needed. 8 g 0   ARIPiprazole (ABILIFY) 10 MG tablet Take 10 mg by mouth every morning. (Patient not taking: Reported on 01/02/2023)     cyclobenzaprine (FLEXERIL) 10 MG tablet Take 1 tablet (10 mg total) by mouth 2 (two) times daily as needed. (Patient not taking: Reported on 01/02/2023) 20 tablet 0   Prenatal Vit-Fe Fumarate-FA (PRENATAL MULTIVITAMIN) TABS tablet Take 1 tablet by mouth  daily at 12 noon. (Patient not taking: Reported on 01/02/2023)     [DISCONTINUED] atenolol (TENORMIN) 25 MG tablet Take 25 mg by mouth daily.     [DISCONTINUED] FLUoxetine (PROZAC) 20 MG capsule Take 1 capsule (20 mg total) by mouth daily. 30 capsule 0   [DISCONTINUED] fluticasone (FLONASE) 50 MCG/ACT nasal spray Place 1 spray into both nostrils daily.     [DISCONTINUED] Norethindrone Acetate-Ethinyl Estrad-FE (LOESTRIN 24 FE) 1-20 MG-MCG(24) tablet Take 1 tablet by mouth daily. (Patient not taking: Reported on 09/05/2018) 3 Package 3   No current facility-administered medications on file prior to visit.    Allergies  Allergen Reactions   Cinnamon Hives   Cinnamon Swelling    Social History   Socioeconomic History   Marital status: Single    Spouse name: Not on file   Number of children: 0   Years of education: 9   Highest education level: Not on file  Occupational History   Occupation: working on [employer]  Tobacco Use   Smoking status: Former    Current packs/day: 0.00    Types: Cigarettes    Quit date: 10/2022    Years since quitting: 0.2   Smokeless tobacco: Never  Vaping Use   Vaping status: Every Day   Substances: Nicotine, THC, Flavoring   Devices: 11/25/22 trying to quit  Substance and Sexual Activity   Alcohol use: No   Drug use: Not Currently    Frequency: 3.0 times per week    Types: Marijuana, Cocaine    Comment: daily use; reports not currently using cocaine   Sexual activity: Yes    Partners: Male    Birth control/protection: None  Other Topics Concern   Not on file  Social History Narrative   Not on file   Social Determinants of Health   Financial Resource Strain: Medium Risk (11/25/2022)   Overall Financial Resource Strain (CARDIA)    Difficulty of Paying Living Expenses: Somewhat hard  Food Insecurity: Food Insecurity Present (11/25/2022)   Hunger Vital Sign    Worried About Running Out of Food in the Last Year: Sometimes true    Ran Out of Food in the  Last Year: Never true  Transportation Needs: No Transportation Needs (11/25/2022)   PRAPARE - Administrator, Civil Service (Medical): No    Lack of Transportation (Non-Medical): No  Physical Activity: Insufficiently Active (11/25/2022)   Exercise Vital Sign    Days of Exercise per Week: 4 days    Minutes of Exercise per Session: 30 min  Stress: No Stress Concern Present (11/25/2022)   [employer] of Occupational Health - Occupational Stress Questionnaire    Feeling of Stress : Not at all  Social Connections: Unknown (11/25/2022)   Social Connection and Isolation Panel [NHANES]    Frequency of Communication with Friends and Family: More than three times a week    Frequency of Social Gatherings with Friends and Family: Three times a week    Attends Religious Services: More than 4 times per year    Active Member of Clubs or Organizations: No    Attends Banker Meetings: Never    Marital Status: Not on file  Intimate Partner Violence: Not At Risk (11/25/2022)   Humiliation, Afraid, Rape, and Kick questionnaire    Fear of Current or Ex-Partner: No    Emotionally Abused: No    Physically Abused: No    Sexually Abused: No    Family History  Problem Relation Age of Onset   Lupus Mother    Heart failure Father    Healthy Brother    Other Brother        cardiomyopathy; heart block   Healthy Maternal Grandmother    Healthy Maternal Grandfather    Other Paternal Grandmother        cardiomyopathy   Healthy Paternal Grandfather    Indications for ASA therapy (per uptodate) One of the following: Previous pregnancy with preeclampsia, especially early onset and with an adverse outcome No Multifetal gestation No Chronic hypertension No Type 1 or 2 diabetes mellitus No Chronic kidney disease No Autoimmune disease (antiphospholipid syndrome, systemic lupus erythematosus) No--family history and has had butterfly rash in past  Two or more of the  following: Nulliparity Yes Obesity (body mass index >30 kg/m2) No Family history of preeclampsia in mother or sister No Age ?35 years No Sociodemographic characteristics (African American race, low socioeconomic level) No Personal risk factors (eg, previous pregnancy with low birth weight or small for gestational age infant, previous adverse pregnancy outcome [eg, stillbirth], interval >10 years between pregnancies) No   The following portions of the patient's history were reviewed and updated as appropriate: allergies, current medications, past OB history, past medical history, past surgical history, past family history, past social history, and problem list.  Review of Systems  Constitutional:  Positive  for malaise/fatigue and weight loss.  HENT: Negative.    Respiratory: Negative.    Cardiovascular: Negative.   Gastrointestinal:  Positive for nausea.  Genitourinary: Negative.   Psychiatric/Behavioral: Negative.        OBJECTIVE: Initial Physical Exam (New OB) Physical Exam Constitutional:      Appearance: Normal appearance.  Cardiovascular:     Rate and Rhythm: Normal rate and regular rhythm.     Heart sounds: Normal heart sounds.  Pulmonary:     Effort: Pulmonary effort is normal.     Breath sounds: Normal breath sounds.  Chest:  Breasts:    Tanner Score is 5.     Breasts are symmetrical.     Right: Normal.     Left: Normal.  Abdominal:     General: Abdomen is flat.     Palpations: Abdomen is soft.  Musculoskeletal:     Cervical back: Normal range of motion.  Skin:    General: Skin is warm and dry.  Neurological:     General: No focal deficit present.     Mental Status: She is alert and oriented to person, place, and time.  Psychiatric:        Mood and Affect: Mood normal.        Behavior: Behavior normal.   FHR: 150  ASSESSMENT/PLAN: Normal pregnancy Hx of asthma, never hospitalized or intubated, no recent rescue inhaler use Bipolar disorder-stopped  abilify with pregnancy. Continues in therapy & has appointment next week. Has appointment with psychiatry and encouraged to keep this. Reviewed increased risk of perinatal mood disorder with pre-existing mood disorder.  Pregnancy management referral with Hillard Danker, RN completed. Smoking cessation-would like to try nicotine patches. Cessation of THC & nicotine recommended. UDS today. Zofran & phenergan prescriptions provided.  Consider daily ASA for family history of Lupus--she has not been diagnosed.   Routine prenatal care. We discussed an overview of prenatal care and when to call. Reviewed diet, exercise, and weight gain recommendations in pregnancy. Discussed benefits of breastfeeding and lactation resources at [facility]. Prenatal panel with NIPT collected today. Questions answered. Warning signs & how to contact providers reviewed.  See orders Dominica Severin, CNM

## 2023-01-02 NOTE — Assessment & Plan Note (Signed)
Stopped Abilify with +UPT. In therapy, established with psychiatry.

## 2023-01-03 LAB — CBC/D/PLT+RPR+RH+ABO+RUBIGG...
Antibody Screen: NEGATIVE
Basophils Absolute: 0 10*3/uL (ref 0.0–0.2)
Basos: 0 %
EOS (ABSOLUTE): 0 10*3/uL (ref 0.0–0.4)
Eos: 0 %
HCV Ab: NONREACTIVE
HIV Screen 4th Generation wRfx: NONREACTIVE
Hematocrit: 35.9 % (ref 34.0–46.6)
Hemoglobin: 11.8 g/dL (ref 11.1–15.9)
Hepatitis B Surface Ag: NEGATIVE
Immature Grans (Abs): 0 10*3/uL (ref 0.0–0.1)
Immature Granulocytes: 0 %
Lymphocytes Absolute: 1.7 10*3/uL (ref 0.7–3.1)
Lymphs: 15 %
MCH: 29.9 pg (ref 26.6–33.0)
MCHC: 32.9 g/dL (ref 31.5–35.7)
MCV: 91 fL (ref 79–97)
Monocytes Absolute: 0.4 10*3/uL (ref 0.1–0.9)
Monocytes: 3 %
Neutrophils Absolute: 9.7 10*3/uL — ABNORMAL HIGH (ref 1.4–7.0)
Neutrophils: 82 %
Platelets: 218 10*3/uL (ref 150–450)
RBC: 3.94 x10E6/uL (ref 3.77–5.28)
RDW: 13.2 % (ref 11.7–15.4)
RPR Ser Ql: NONREACTIVE
Rh Factor: NEGATIVE
Rubella Antibodies, IGG: 3.94 {index} (ref >0.99)
Varicella zoster IgG: 779 {index} (ref >165)
WBC: 11.9 10*3/uL — ABNORMAL HIGH (ref 3.4–10.8)

## 2023-01-03 LAB — TOXOPLASMA ANTIBODIES- IGG AND  IGM
Toxoplasma Antibody- IgM: <3 [AU]/ml (ref 0.0–7.9)
Toxoplasma IgG Ratio: <3 IU/mL (ref 0.0–7.1)

## 2023-01-03 LAB — HCV INTERPRETATION

## 2023-01-03 LAB — HEPATITIS C ANTIBODY

## 2023-01-04 LAB — URINE CULTURE, OB REFLEX

## 2023-01-04 LAB — CULTURE, OB URINE

## 2023-01-05 LAB — MONITOR DRUG PROFILE 14(MW)
Amphetamine Scrn, Ur: NEGATIVE ng/mL
BARBITURATE SCREEN URINE: NEGATIVE ng/mL
BENZODIAZEPINE SCREEN, URINE: NEGATIVE ng/mL
Buprenorphine, Urine: NEGATIVE ng/mL
Cocaine (Metab) Scrn, Ur: NEGATIVE ng/mL
Creatinine(Crt), U: 287.7 mg/dL (ref 20.0–300.0)
Fentanyl, Urine: NEGATIVE pg/mL
Meperidine Screen, Urine: NEGATIVE ng/mL
Methadone Screen, Urine: NEGATIVE ng/mL
OXYCODONE+OXYMORPHONE UR QL SCN: NEGATIVE ng/mL
Opiate Scrn, Ur: NEGATIVE ng/mL
Ph of Urine: 7.1 (ref 4.5–8.9)
Phencyclidine Qn, Ur: NEGATIVE ng/mL
Propoxyphene Scrn, Ur: NEGATIVE ng/mL
SPECIFIC GRAVITY: 1.014
Tramadol Screen, Urine: NEGATIVE ng/mL

## 2023-01-05 LAB — CANNABINOID (GC/MS), URINE
Cannabinoid: POSITIVE — AB
Carboxy THC (GC/MS): 567 ng/mL

## 2023-01-06 LAB — MATERNIT 21 PLUS CORE, BLOOD
Fetal Fraction: 9
Result (T21): NEGATIVE
Trisomy 13 (Patau syndrome): NEGATIVE
Trisomy 18 (Edwards syndrome): NEGATIVE
Trisomy 21 (Down syndrome): NEGATIVE

## 2023-01-30 ENCOUNTER — Encounter: Payer: Self-pay | Admitting: Obstetrics

## 2023-01-30 ENCOUNTER — Ambulatory Visit (INDEPENDENT_AMBULATORY_CARE_PROVIDER_SITE_OTHER): Payer: MEDICAID | Admitting: Obstetrics

## 2023-01-30 VITALS — BP 112/71 | HR 72 | Wt 167.0 lb

## 2023-01-30 DIAGNOSIS — Z3689 Encounter for other specified antenatal screening: Secondary | ICD-10-CM

## 2023-01-30 DIAGNOSIS — Z3A16 16 weeks gestation of pregnancy: Secondary | ICD-10-CM

## 2023-01-30 DIAGNOSIS — Z3480 Encounter for supervision of other normal pregnancy, unspecified trimester: Secondary | ICD-10-CM

## 2023-01-30 NOTE — Assessment & Plan Note (Signed)
-  Encouraged Unisom/B6, small regular meal, protein snack.  -Recommended against marijuana for nausea control -Working on cutting back on vape; using nicotine patches occasionally -Anticipatory guidance about the second trimester -Anatomy scan ordered

## 2023-01-30 NOTE — Progress Notes (Signed)
    Return Prenatal Note   Assessment/Plan   Plan  22 y.o. G1P0 at 104w6d presents for follow-up OB visit. Reviewed prenatal record including previous visit note.  No problem-specific Assessment & Plan notes found for this encounter.    Orders Placed This Encounter  Procedures   US OB Comp + 14 Wk    Standing Status:   Future    Standing Expiration Date:   05/01/2023    Order Specific Question:   Reason for Exam (SYMPTOM  OR DIAGNOSIS REQUIRED)    Answer:   anatomy    Order Specific Question:   Preferred Imaging Location?    Answer:   Internal   Return in about 4 weeks (around 02/27/2023).   Future Appointments  Date Time Provider Department Center  02/06/2023  4:00 PM [facility]  02/27/2023  1:15 PM Free, Lindalou Hose, CNM [facility] None    For next visit:  continue with routine prenatal care     Subjective   Robin Pineda is still having frequent nausea and vomiting. This is relieved some with Zofran, occasional Phenergan, and marijuana. She is not currently taking any medications for bipolar.  Movement: Present Contractions: Not present  Objective   Flow sheet Vitals: Pulse Rate: 72 BP: 112/71 Fetal Heart Rate (bpm): 145 Total weight gain: 7 lb (3.175 kg)  General Appearance  No acute distress, well appearing, and well nourished Pulmonary   Normal work of breathing Neurologic   Alert and oriented to person, place, and time Psychiatric   Mood and affect within normal limits  Robin Pineda Spanish, CNM 01/30/23

## 2023-01-31 ENCOUNTER — Encounter (HOSPITAL_COMMUNITY): Payer: Self-pay | Admitting: Obstetrics and Gynecology

## 2023-01-31 ENCOUNTER — Ambulatory Visit
Admission: EM | Admit: 2023-01-31 | Discharge: 2023-01-31 | Disposition: A | Discharge status: Home / Self Care | Payer: MEDICAID

## 2023-01-31 ENCOUNTER — Inpatient Hospital Stay (HOSPITAL_COMMUNITY)
Admission: AD | Admit: 2023-01-31 | Discharge: 2023-01-31 | Disposition: A | Discharge status: Home / Self Care | Payer: MEDICAID | Source: Ambulatory Visit | Attending: Family Medicine | Admitting: Family Medicine

## 2023-01-31 DIAGNOSIS — O211 Hyperemesis gravidarum with metabolic disturbance: Secondary | ICD-10-CM | POA: Insufficient documentation

## 2023-01-31 DIAGNOSIS — O99342 Other mental disorders complicating pregnancy, second trimester: Secondary | ICD-10-CM | POA: Insufficient documentation

## 2023-01-31 DIAGNOSIS — R42 Dizziness and giddiness: Secondary | ICD-10-CM | POA: Diagnosis present

## 2023-01-31 DIAGNOSIS — F319 Bipolar disorder, unspecified: Secondary | ICD-10-CM | POA: Insufficient documentation

## 2023-01-31 DIAGNOSIS — R1110 Vomiting, unspecified: Secondary | ICD-10-CM | POA: Diagnosis present

## 2023-01-31 DIAGNOSIS — Z3A17 17 weeks gestation of pregnancy: Secondary | ICD-10-CM | POA: Diagnosis not present

## 2023-01-31 DIAGNOSIS — O219 Vomiting of pregnancy, unspecified: Secondary | ICD-10-CM

## 2023-01-31 DIAGNOSIS — R519 Headache, unspecified: Secondary | ICD-10-CM | POA: Diagnosis present

## 2023-01-31 DIAGNOSIS — E860 Dehydration: Secondary | ICD-10-CM

## 2023-01-31 HISTORY — DX: Fracture of nasal bones, initial encounter for closed fracture: S02.2XXA

## 2023-01-31 HISTORY — DX: Unspecified fracture of shaft of humerus, left arm, initial encounter for closed fracture: S42.302A

## 2023-01-31 HISTORY — DX: Unspecified fracture of shaft of humerus, right arm, initial encounter for closed fracture: S42.301A

## 2023-01-31 HISTORY — DX: Depression, unspecified: F32.A

## 2023-01-31 HISTORY — DX: Anxiety disorder, unspecified: F41.9

## 2023-01-31 HISTORY — DX: Urinary tract infection, site not specified: N39.0

## 2023-01-31 LAB — URINALYSIS, ROUTINE W REFLEX MICROSCOPIC
Bilirubin Urine: NEGATIVE
Glucose, UA: NEGATIVE mg/dL
Hgb urine dipstick: NEGATIVE
Ketones, ur: 80 mg/dL — AB
Nitrite: NEGATIVE
Protein, ur: 100 mg/dL — AB
Specific Gravity, Urine: 1.026 (ref 1.005–1.030)
pH: 5 (ref 5.0–8.0)

## 2023-01-31 MED ORDER — METOCLOPRAMIDE HCL 10 MG PO TABS: 10.0000 mg | ORAL_TABLET | Freq: Three times a day (TID) | ORAL | 0 refills | Status: DC

## 2023-01-31 MED ORDER — FAMOTIDINE 20 MG PO TABS: 20.0000 mg | ORAL_TABLET | Freq: Two times a day (BID) | ORAL | 3 refills | Status: DC

## 2023-01-31 MED ORDER — LACTATED RINGERS IV BOLUS
1000.0000 mL | Freq: Once | INTRAVENOUS | Status: AC
  Administered 2023-01-31: 1000 mL via INTRAVENOUS

## 2023-01-31 MED ORDER — ONDANSETRON 4 MG PO TBDP
8.0000 mg | ORAL_TABLET | Freq: Once | ORAL | Status: AC
  Administered 2023-01-31: 8 mg via ORAL
  Filled 2023-01-31: qty 2

## 2023-01-31 MED ORDER — CYCLOBENZAPRINE HCL 5 MG PO TABS: 10.0000 mg | ORAL_TABLET | Freq: Once | ORAL | Status: DC

## 2023-01-31 MED ORDER — FAMOTIDINE IN NACL 20-0.9 MG/50ML-% IV SOLN
20.0000 mg | Freq: Once | INTRAVENOUS | Status: AC
  Administered 2023-01-31: 20 mg via INTRAVENOUS
  Filled 2023-01-31: qty 50

## 2023-01-31 MED ORDER — PANTOPRAZOLE SODIUM 40 MG IV SOLR
40.0000 mg | Freq: Once | INTRAVENOUS | Status: AC
  Administered 2023-01-31: 40 mg via INTRAVENOUS
  Filled 2023-01-31: qty 10

## 2023-01-31 MED ORDER — SODIUM CHLORIDE 0.9 % IV SOLN
25.0000 mg | Freq: Once | INTRAVENOUS | Status: AC
  Administered 2023-01-31: 25 mg via INTRAVENOUS
  Filled 2023-01-31: qty 1

## 2023-01-31 NOTE — MAU Provider Note (Signed)
History     CSN: [id number]  Arrival date and time: 01/31/23 1317   Event Date/Time   First Provider Initiated Contact with Patient 01/31/23 1449      Chief Complaint  Patient presents with   Dizziness   Headache   Emesis   Fatigue   HPI Robin Pineda is a 22 year old G1 at [redacted] weeks pregnant presenting with nausea and vomiting.  She has baseline nausea and vomiting in this pregnancy.  She has home medications of Zofran and Phenergan.  She typically has nausea in the mornings.  But for the past 2 days has been unable to keep anything down.  She reports she last took Zofran yesterday.  She does continue to have regular urination though it is much darker and less frequent.  She reports no fevers, chills.  She denies any dysuria polyuria or other infectious symptoms.  She denies any recent sick contacts.  She also reports that no family members are also ill.  She denies any recent exposures to new restaurants or new foods.  OB History     Gravida  1   Para      Term      Preterm      AB      Living         SAB      IAB      Ectopic      Multiple      Live Births              Past Medical History:  Diagnosis Date   Anxiety    Arm fracture, left    Arm fracture, right    Asthma    Broken nose    Depression    Enlarged tonsils    Headache    POTS (postural orthostatic tachycardia syndrome)    Seasonal allergies    UTI (urinary tract infection)     Past Surgical History:  Procedure Laterality Date   OTHER SURGICAL HISTORY Right 2011   Pins placed to repair break in right arm   TONSILECTOMY, ADENOIDECTOMY, BILATERAL MYRINGOTOMY AND TUBES     TONSILLECTOMY      Family History  Problem Relation Age of Onset   Lupus Mother    Heart failure Father    Diabetes Father    Hypertension Father    Healthy Brother    Other Brother        cardiomyopathy; heart block   Healthy Maternal Grandmother    Healthy Maternal Grandfather    Other Paternal Grandmother         cardiomyopathy   Healthy Paternal Grandfather     Social History   Tobacco Use   Smoking status: Former    Current packs/day: 0.00    Types: Cigarettes    Quit date: 10/2022    Years since quitting: 0.3   Smokeless tobacco: Never   Tobacco comments:    Trying to stop using patches  Vaping Use   Vaping status: Some Days   Substances: Nicotine, THC, Flavoring   Devices: 11/25/22 trying to quit  Substance Use Topics   Alcohol use: No   Drug use: Not Currently    Frequency: 7.0 times per week    Types: Marijuana, Cocaine    Comment: daily marijuana, 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    Allergies:  Allergies  Allergen Reactions   Cinnamon Hives   Cinnamon Swelling    Medications Prior to Admission  Medication Sig Dispense Refill Last Dose  nicotine (NICODERM CQ - DOSED IN MG/24 HOURS) 14 mg/24hr patch Place 1 patch (14 mg total) onto the skin daily. 30 patch 0 Past Week   ondansetron (ZOFRAN-ODT) 4 MG disintegrating tablet Take 1 tablet (4 mg total) by mouth every 8 (eight) hours as needed for nausea or vomiting. 30 tablet 3 01/30/2023   Prenat-FeCbn-FeAsp-Meth-FA-DHA (PRENATE MINI) 18-0.6-0.4-350 MG CAPS Take 1 tablet by mouth daily. 90 capsule 4 01/30/2023   promethazine (PHENERGAN) 12.5 MG tablet Take 1 tablet (12.5 mg total) by mouth every 6 (six) hours as needed for nausea or vomiting. 30 tablet 3 Past Month   albuterol (VENTOLIN HFA) 108 (90 Base) MCG/ACT inhaler Inhale 1-2 puffs into the lungs every 6 (six) hours as needed. 8 g 0 More than a month   aspirin EC 81 MG tablet Take 1 tablet (81 mg total) by mouth daily. Take after 12 weeks for prevention of preeclampssia later in pregnancy 300 tablet 2    Prenatal Vit-Fe Fumarate-FA (PRENATAL MULTIVITAMIN) TABS tablet Take 1 tablet by mouth daily at 12 noon. (Patient not taking: Reported on 01/02/2023)       Review of Systems  Constitutional:  Negative for chills and fever.  HENT:  Negative for congestion and sore throat.    Eyes:  Negative for pain and visual disturbance.  Respiratory:  Negative for cough, chest tightness and shortness of breath.   Cardiovascular:  Negative for chest pain.  Gastrointestinal:  Negative for abdominal pain, diarrhea, nausea and vomiting.  Endocrine: Negative for cold intolerance and heat intolerance.  Genitourinary:  Negative for dysuria and flank pain.  Musculoskeletal:  Negative for back pain.  Skin:  Negative for rash.  Allergic/Immunologic: Negative for food allergies.  Neurological:  Negative for dizziness and light-headedness.  Psychiatric/Behavioral:  Negative for agitation.    Physical Exam   Blood pressure (!) 96/47, pulse 77, temperature 97.8 F (36.6 C), temperature source Oral, resp. rate 18, height 5\' 2"  (1.575 m), weight 74.8 kg, last menstrual period 10/04/2022, SpO2 98%.  Physical Exam Vitals and nursing note reviewed.  Constitutional:      General: She is not in acute distress.    Appearance: She is well-developed.     Comments: Pregnant female  HENT:     Head: Normocephalic and atraumatic.  Eyes:     General: No scleral icterus.    Conjunctiva/sclera: Conjunctivae normal.  Cardiovascular:     Rate and Rhythm: Normal rate.  Pulmonary:     Effort: Pulmonary effort is normal.  Chest:     Chest wall: No tenderness.  Abdominal:     Palpations: Abdomen is soft.     Tenderness: There is abdominal tenderness (mild mid-epigastric pain). There is no guarding or rebound.     Comments: Gravid  Genitourinary:    Vagina: Normal.  Musculoskeletal:        General: Normal range of motion.     Cervical back: Normal range of motion and neck supple.  Skin:    General: Skin is warm and dry.     Findings: No rash.  Neurological:     Mental Status: She is alert and oriented to person, place, and time.     MAU Course  Procedures   MDM: high  This patient presents to the ED for concern of   Chief Complaint  Patient presents with   Dizziness    Headache   Emesis   Fatigue     These complains involves an extensive number of treatment options, and  is a complaint that carries with it a high risk of complications and morbidity.  The differential diagnosis for  1. Vomiting in pregnancy INCLUDES infectious causes (less likely due to lack of infectious sx like fever/chills and otherwise normal vital signs, most likely normal variant. No signs of profound dehydration on exam.   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  The pertinent results include:   Results for orders placed or performed during the hospital encounter of 01/31/23 (from the past 24 hour(s))  Urinalysis, Routine w reflex microscopic -Urine, Clean Catch     Status: Abnormal   Collection Time: 01/31/23  2:31 PM  Result Value Ref Range   Color, Urine AMBER (A) YELLOW   APPearance HAZY (A) CLEAR   Specific Gravity, Urine 1.026 1.005 - 1.030   pH 5.0 5.0 - 8.0   Glucose, UA NEGATIVE NEGATIVE mg/dL   Hgb urine dipstick NEGATIVE NEGATIVE   Bilirubin Urine NEGATIVE NEGATIVE   Ketones, ur 80 (A) NEGATIVE mg/dL   Protein, ur 161 (A) NEGATIVE mg/dL   Nitrite NEGATIVE NEGATIVE   Leukocytes,Ua TRACE (A) NEGATIVE   RBC / HPF 0-5 0 - 5 RBC/hpf   WBC, UA 6-10 0 - 5 WBC/hpf   Bacteria, UA FEW (A) NONE SEEN   Squamous Epithelial  / HPF 11-20 0 - 5 /HPF   Mucus PRESENT     Medicines ordered and prescription drug management:  Medications: Zofran, Phenergan, Protonix, pepcid   Reevaluation of the patient after these medicines showed that the patient improved I have reviewed the patients home medicines and have made adjustments as needed  Critical Interventions: IV fluids  MAU Course:  6:29 PM Completing 2nd bag of LR. Feeling better. No more emesis. Discussed PO trial. She is going to try juice and crackers. If she tolerates this we will send her home with rx for medications.     6:35 PM Tolerated PO juice and crackers. Ready for discharge.   After the interventions noted above, I reevaluated the patient and found that they have :improved  Dispostion: discharged   Assessment and Plan   1. Nausea and vomiting during pregnancy prior to [redacted] weeks gestation   2. Dehydration   3. [redacted] weeks gestation of pregnancy   - Improved with fluids and meds - Rx sent for relgan, pepcid - Recommend taking reglan TID scheduled for 3-4 days and then scaling back. Instructed she can also take them PRN - Reviewed small frequent eating events - If she presents again I would recommend CMP.  Allergies as of 01/31/2023       Reactions   Cinnamon Hives   Cinnamon Swelling        Medication List     STOP taking these medications    aspirin EC 81 MG tablet       TAKE these medications    albuterol 108 (90 Base) MCG/ACT inhaler Commonly known  as: VENTOLIN HFA Inhale 1-2 puffs into the lungs every 6 (six) hours as needed.   famotidine 20 MG tablet Commonly known as: PEPCID Take 1 tablet (20 mg total) by mouth 2 (two) times daily.   metoCLOPramide 10 MG tablet Commonly known as: REGLAN Take 1 tablet (10 mg total) by mouth 3 (three) times daily before meals.   nicotine 14 mg/24hr patch Commonly known as: NICODERM CQ - dosed in mg/24 hours Place 1 patch (14 mg total) onto the skin daily.   ondansetron 4 MG  disintegrating tablet Commonly known as: ZOFRAN-ODT Take 1 tablet (4 mg total) by mouth every 8 (eight) hours as needed for nausea or vomiting.   prenatal multivitamin Tabs tablet Take 1 tablet by mouth daily at 12 noon.   Prenate Mini 18-0.6-0.4-350 MG Caps Take 1 tablet by mouth daily.   promethazine 12.5 MG tablet Commonly known as: PHENERGAN Take 1 tablet (12.5 mg total) by mouth every 6 (six) hours as needed for nausea or vomiting.         Isa Rankin [facility] 01/31/2023, 6:29 PM

## 2023-01-31 NOTE — MAU Note (Signed)
Robin Pineda is a 22 y.o. at [redacted]w[redacted]d here in MAU reporting: has been really dizzy and weak.  Keeps throwing up.  HA started an hour or 2 ago.  Denies BLeeding. Onset of complaint: last night Pain score: . 8 Vitals:   01/31/23 1349  BP: 124/76  Pulse: 84  Resp: 18  Temp: 97.8 F (36.6 C)  SpO2: 100%     FHT:146 Lab orders placed from triage:  urine   Orthostatics checked

## 2023-01-31 NOTE — ED Triage Notes (Signed)
Pt presents with complaints of feeling dizzy, bilateral leg weakness, and emesis so much she can't count. Reports feeling this way x 2 day. Pt is [redacted] weeks pregnant. Denies any pain.

## 2023-01-31 NOTE — ED Provider Notes (Signed)
Patient here today for evaluation of uncontrolled vomiting, dizziness, that started last night. She is [redacted] weeks pregnant. Recommended further evaluation in the ED. Family member to transport.   Tomi Bamberger, PA-C 01/31/23 1231

## 2023-01-31 NOTE — ED Notes (Signed)
Patient is being discharged from the Urgent Care and sent to the Emergency Department via POV . Per Corinna Capra PA, patient is in need of higher level of care due to hyperemesis, new onset dizziness and weakness. Patient is aware and verbalizes understanding of plan of care.  Vitals:   01/31/23 1220  BP: 109/76  Pulse: 82  Resp: 18  Temp: 98.9 F (37.2 C)  SpO2: 97%

## 2023-02-06 ENCOUNTER — Ambulatory Visit
Admission: RE | Admit: 2023-02-06 | Discharge: 2023-02-06 | Disposition: A | Discharge status: Home / Self Care | Payer: MEDICAID | Source: Ambulatory Visit | Attending: Obstetrics | Admitting: Obstetrics

## 2023-02-06 DIAGNOSIS — Z3689 Encounter for other specified antenatal screening: Secondary | ICD-10-CM | POA: Diagnosis present

## 2023-02-24 DIAGNOSIS — O26899 Other specified pregnancy related conditions, unspecified trimester: Secondary | ICD-10-CM | POA: Insufficient documentation

## 2023-02-24 DIAGNOSIS — Z6791 Unspecified blood type, Rh negative: Secondary | ICD-10-CM | POA: Insufficient documentation

## 2023-02-24 HISTORY — DX: Unspecified blood type, rh negative: Z67.91

## 2023-02-27 DIAGNOSIS — O26899 Other specified pregnancy related conditions, unspecified trimester: Secondary | ICD-10-CM

## 2023-02-27 DIAGNOSIS — Z3480 Encounter for supervision of other normal pregnancy, unspecified trimester: Secondary | ICD-10-CM

## 2023-02-28 ENCOUNTER — Ambulatory Visit (INDEPENDENT_AMBULATORY_CARE_PROVIDER_SITE_OTHER): Payer: MEDICAID | Admitting: Licensed Practical Nurse

## 2023-02-28 ENCOUNTER — Encounter: Payer: Self-pay | Admitting: Licensed Practical Nurse

## 2023-02-28 VITALS — BP 121/66 | HR 95 | Wt 172.3 lb

## 2023-02-28 DIAGNOSIS — Z3480 Encounter for supervision of other normal pregnancy, unspecified trimester: Secondary | ICD-10-CM

## 2023-02-28 DIAGNOSIS — Z3A21 21 weeks gestation of pregnancy: Secondary | ICD-10-CM

## 2023-02-28 LAB — POCT URINALYSIS DIPSTICK
Bilirubin, UA: NEGATIVE
Blood, UA: POSITIVE
Glucose, UA: NEGATIVE
Ketones, UA: NEGATIVE
Leukocytes, UA: NEGATIVE
Nitrite, UA: NEGATIVE
Protein, UA: POSITIVE — AB
Spec Grav, UA: 1.015 (ref 1.010–1.025)
Urobilinogen, UA: 1 U/dL
pH, UA: 7 (ref 5.0–8.0)

## 2023-02-28 NOTE — Progress Notes (Signed)
Routine Prenatal Care Visit  Subjective  Robin Pineda is a 22 y.o. G1P0 at [redacted]w[redacted]d being seen today for ongoing prenatal care.  She is currently monitored for the following issues for this low-risk pregnancy and has Migraine without aura and without status migrainosus, not intractable; Episodic tension-type headache; Obesity; Disequilibrium; MDD (major depressive disorder), recurrent, severe, with psychosis (HCC); Supervision of other normal pregnancy, antepartum; Bipolar disorder (HCC); and Rh negative state in antepartum period on their problem list.  ----------------------------------------------------------------------------------- Patient reports  rash on her abdomen-it started on her neck, it has cleared up on the neck now it is mainly on her stretch marks, it is worse at night, she uses gold bond medicated cream which helps. ,  -scattered area of pinpoint red raised area on abdomen appears to be razor burn from shaving -considering waterbirth, but desires an epidural, her mother may be her support person (she has a complicated relationship with her mother). Signed up classes through [facility] -she and her father are in the moving process, she is not sure where she will give birth.  -declined flu vaccine   Contractions: Not present. Vag. Bleeding: None.  Movement: Present. Leaking Fluid denies.  ----------------------------------------------------------------------------------- The following portions of the patient's history were reviewed and updated as appropriate: allergies, current medications, past family history, past medical history, past social history, past surgical history and problem list. Problem list updated.  Objective  Blood pressure 121/66, pulse 95, weight 172 lb 4.8 oz (78.2 kg), last menstrual period 10/04/2022. Pregravid weight 160 lb (72.6 kg) Total Weight Gain 12 lb 4.8 oz (5.579 kg) Urinalysis: Urine Protein    Urine Glucose    Fetal Status: Fetal Heart Rate (bpm): 145  Fundal Height: 22 cm Movement: Present     General:  Alert, oriented and cooperative. Patient is in no acute distress.  Skin: Skin is warm and dry. No rash noted.   Cardiovascular: Normal heart rate noted  Respiratory: Normal respiratory effort, no problems with respiration noted  Abdomen: Soft, gravid, appropriate for gestational age. Pain/Pressure: Absent     Pelvic:  Cervical exam deferred        Extremities: Normal range of motion.  Edema: Trace  Mental Status: Normal mood and affect. Normal behavior. Normal judgment and thought content.   Assessment   22 y.o. G1P0 at [redacted]w[redacted]d by  07/11/2023, Date entered prior to episode creation presenting for routine prenatal visit  Plan   first Problems (from 11/24/22 to present)     Problem Noted Resolved   Supervision of other normal pregnancy, antepartum 11/25/2022 by Loran Senters, CMA No   Overview Addendum 02/24/2023 12:20 PM by Burney Gauze, CNM     Clinical Staff Provider  Office Location  [facility] Dating  07/11/2023, Date entered prior to episode creation  Language  English Anatomy US    Flu Vaccine  offer Genetic Screen  NIPS: low risk  TDaP vaccine   offer Hgb A1C or  GTT Early : Third trimester :   Covid declined   LAB RESULTS   Rhogam  AB/Negative/-- (08/26 1157)  Blood Type AB/Negative/-- (08/26 1157)   Feeding Plan breast Antibody Negative (08/26 1157)  Contraception undecided Rubella 3.94 (08/26 1157)  Circumcision yes RPR Non Reactive (08/26 1157)   Pediatrician  In [place] HBsAg Negative (08/26 1157)   Support Person undecided HIV Non Reactive (08/26 1157)  Prenatal Classes yes Varicella immune    GBS  (For PCN allergy, check sensitivities)   BTL Consent  Hep  C Non Reactive (08/26 1157)   VBAC Consent  Pap No results found for: "DIAGPAP"    Hgb Electro      CF      SMA                    Preterm labor symptoms and general obstetric precautions including but not limited to vaginal bleeding,  contractions, leaking of fluid and fetal movement were reviewed in detail with the patient. Please refer to After Visit Summary for other counseling recommendations.   Return in about 4 weeks (around 03/28/2023) for ROB.  Carie Caddy, CNM   [facility]  02/28/23  3:22 PM

## 2023-03-02 LAB — URINE CULTURE

## 2023-03-03 ENCOUNTER — Encounter: Payer: Self-pay | Admitting: Certified Nurse Midwife

## 2023-03-28 ENCOUNTER — Encounter: Payer: Self-pay | Admitting: Advanced Practice Midwife

## 2023-03-28 ENCOUNTER — Ambulatory Visit (INDEPENDENT_AMBULATORY_CARE_PROVIDER_SITE_OTHER): Payer: MEDICAID | Admitting: Advanced Practice Midwife

## 2023-03-28 VITALS — BP 123/74 | HR 106 | Wt 174.0 lb

## 2023-03-28 DIAGNOSIS — Z369 Encounter for antenatal screening, unspecified: Secondary | ICD-10-CM

## 2023-03-28 DIAGNOSIS — Z3402 Encounter for supervision of normal first pregnancy, second trimester: Secondary | ICD-10-CM

## 2023-03-28 DIAGNOSIS — Z131 Encounter for screening for diabetes mellitus: Secondary | ICD-10-CM

## 2023-03-28 DIAGNOSIS — Z3A25 25 weeks gestation of pregnancy: Secondary | ICD-10-CM

## 2023-03-28 DIAGNOSIS — Z113 Encounter for screening for infections with a predominantly sexual mode of transmission: Secondary | ICD-10-CM

## 2023-03-28 DIAGNOSIS — Z130 Encounter for screening for diseases of the blood and blood-forming organs and certain disorders involving the immune mechanism: Secondary | ICD-10-CM

## 2023-03-28 NOTE — Progress Notes (Signed)
Routine Prenatal Care Visit  Subjective  Robin Pineda is a 22 y.o. G1P0 at [redacted]w[redacted]d being seen today for ongoing prenatal care.  She is currently monitored for the following issues for this low-risk pregnancy and has Migraine without aura and without status migrainosus, not intractable; Episodic tension-type headache; Obesity; Disequilibrium; MDD (major depressive disorder), recurrent, severe, with psychosis (HCC); Supervision of normal pregnancy; Bipolar disorder (HCC); and Rh negative state in antepartum period on their problem list.  ----------------------------------------------------------------------------------- Patient reports an abdominal cramp about 4 weeks ago that lasted for 10-15 minutes. She has not had any since. We discussed upcoming 28 week visit- lab and rhogam injection. Also advised that she stay well hydrated. Contractions: Not present. Vag. Bleeding: None.  Movement: Present. Leaking Fluid denies.  ----------------------------------------------------------------------------------- The following portions of the patient's history were reviewed and updated as appropriate: allergies, current medications, past family history, past medical history, past social history, past surgical history and problem list. Problem list updated.  Objective  Blood pressure 123/74, pulse (!) 106, weight 174 lb (78.9 kg), last menstrual period 10/04/2022. Pregravid weight 160 lb (72.6 kg) Total Weight Gain 14 lb (6.35 kg) Urinalysis: Urine Protein    Urine Glucose    Fetal Status: Fetal Heart Rate (bpm): 148 Fundal Height: 25 cm Movement: Present     General:  Alert, oriented and cooperative. Patient is in no acute distress.  Skin: Skin is warm and dry. No rash noted.   Cardiovascular: Normal heart rate noted  Respiratory: Normal respiratory effort, no problems with respiration noted  Abdomen: Soft, gravid, appropriate for gestational age. Pain/Pressure: Absent     Pelvic:  Cervical exam deferred         Extremities: Normal range of motion.  Edema: Trace  Mental Status: Normal mood and affect. Normal behavior. Normal judgment and thought content.   Assessment   22 y.o. G1P0 at [redacted]w[redacted]d by  07/11/2023, Date entered prior to episode creation presenting for routine prenatal visit  Plan   first Problems (from 11/24/22 to present)     Problem Noted Resolved   Supervision of normal pregnancy 11/25/2022 by Loran Senters, CMA No   Overview Addendum 02/28/2023  3:22 PM by Ellwood Sayers, CNM     Clinical Staff Provider  Office Location  [facility] Dating  07/11/2023, LMP  Language  English Anatomy US  normal  Flu Vaccine  declined Genetic Screen  NIPS: low risk  TDaP vaccine   offer Hgb A1C or  GTT Early : Third trimester :   Covid declined   LAB RESULTS   Rhogam  AB/Negative/-- (08/26 1157)  Blood Type AB/Negative/-- (08/26 1157)   Feeding Plan breast Antibody Negative (08/26 1157)  Contraception undecided Rubella 3.94 (08/26 1157)  Circumcision yes RPR Non Reactive (08/26 1157)   Pediatrician  In [place] HBsAg Negative (08/26 1157)   Support Person undecided HIV Non Reactive (08/26 1157)  Prenatal Classes yes Varicella immune    GBS  (For PCN allergy, check sensitivities)   BTL Consent  Hep C Non Reactive (08/26 1157)   VBAC Consent  Pap No results found for: "DIAGPAP"    Hgb Electro      CF      SMA                    Preterm labor symptoms and general obstetric precautions including but not limited to vaginal bleeding, contractions, leaking of fluid and fetal movement were reviewed in detail with the patient. Please  refer to After Visit Summary for other counseling recommendations.   Return in about 3 weeks (around 04/18/2023) for 28 week labs and rob.  Tresea Mall, CNM 03/28/2023 3:24 PM

## 2023-03-28 NOTE — Patient Instructions (Signed)
Rh Incompatibility  Rh incompatibility is a condition that can happen during pregnancy if you have Rh-negative blood and your baby has Rh-positive blood. Rh-negative and Rh-positive refers to whether your blood has a specific protein found on the surface of red blood cells (Rh factor). If you have Rh factor on your blood cells, you are Rh-positive. If you do not have an Rh factor, you are Rh-negative. Being Rh-negative does not affect your health, but it can cause problems during your pregnancy. How does Rh incompatibility affect me? During pregnancy, blood from your baby can cross into your bloodstream, especially during delivery. If you are Rh-negative and your baby is Rh-positive, your body's defense system (immune system) will react to your baby's blood as if it were a foreign substance and will create certain proteins (antibodies) in response to it. This process is called sensitization. Rh incompatibility can also happen if you are Rh-negative and you receive a donation (transfusion) of Rh-positive blood. How does Rh incompatibility affect my baby? If you become sensitized, your Rh antibodies will cross the placenta in future pregnancies and attack your baby's blood if your baby's blood is Rh-positive. This attack on your baby's blood cells can lead to a very serious problem called hemolytic disease. This condition causes the baby's red blood cells to be destroyed faster than normal or to break down too quickly. This can cause: Low amounts of red blood cells (anemia). Yellowing of the skin and the whites of the eyes (jaundice). Swelling of the body (edema). Brain damage. Heart failure. Death. These antibodies usually do not cause problems during your first pregnancy. This is because blood from your baby often crosses into your bloodstream during delivery, and your baby is born before many of the antibodies can develop. However, once antibodies have formed, they stay in your body. Because of this,  Rh incompatibility is more likely to cause problems in second or later pregnancies if the baby is Rh-positive. How do I know if I have this condition?  When you become pregnant, you may have blood tests to determine your blood type and Rh factor. If you are Rh-negative, you may have another blood test called an antibody screen. The antibody screen shows whether you have Rh antibodies in your blood. If you do, it may mean that you have been exposed to Rh-positive blood before, and that you have a risk for Rh incompatibility. To find out whether your baby is developing hemolytic disease and how serious it is, health care providers may use more tests, such as an ultrasound. How is this condition treated? This condition is treated with a medicine called Rho (D) immune globulin. This medicine keeps your body from making antibodies that can cause serious problems for your baby or for future babies. You will get one shot around week 28 of your pregnancy and the other within 72 hours of your baby's birth. If you are Rh-negative, you will need this medicine every time you have a baby with Rh-positive blood. If you are Rh-negative and there is a high risk of blood transfer between you and your baby, you may be given Rho (D) immune globulin. The risk of blood transfer is high if you experience: Amniocentesis. This is a procedure to remove a small amount of the fluid that surrounds a baby in the uterus (amniotic fluid) so that it can be tested. A miscarriage or an abortion. An ectopic pregnancy. This is a pregnancy in which the fertilized egg attaches (implants) outside the uterus. Any  vaginal bleeding during pregnancy. Trauma to your belly area during your pregnancy. A procedure to turn your baby to a head-down position for delivery (version). If you already have antibodies in your blood, your pregnancy will be closely monitored. You will not be given Rho (D) immune globulin because it is not effective in these  cases. Summary Rh incompatibility is a condition that occurs during pregnancy if you have Rh-negative blood and your baby has Rh-positive blood. If you are Rh-negative and your baby is Rh-positive, your immune system will react to the baby's blood and will create Rh antibodies. Rh antibodies can attack your baby's red blood cells and can lead to hemolytic disease. This condition can cause a serious anemia in your baby. Rh incompatibility is treated with a medicine called Rho (D) immune globulin. This medicine keeps your body from making antibodies that can cause serious problems in your baby or future babies. This information is not intended to replace advice given to you by your health care provider. Make sure you discuss any questions you have with your health care provider. Document Revised: 02/19/2020 Document Reviewed: 02/19/2020 Elsevier Patient Education  2024 [employer].

## 2023-04-02 ENCOUNTER — Encounter: Payer: Self-pay | Admitting: Certified Nurse Midwife

## 2023-04-04 ENCOUNTER — Other Ambulatory Visit: Payer: Self-pay | Admitting: Certified Nurse Midwife

## 2023-04-04 MED ORDER — DOXYLAMINE-PYRIDOXINE 10-10 MG PO TBEC: 4.0000 | DELAYED_RELEASE_TABLET | Freq: Every day | ORAL | 2 refills | Status: AC

## 2023-04-18 ENCOUNTER — Other Ambulatory Visit: Payer: MEDICAID

## 2023-04-18 ENCOUNTER — Ambulatory Visit (INDEPENDENT_AMBULATORY_CARE_PROVIDER_SITE_OTHER): Payer: MEDICAID | Admitting: Certified Nurse Midwife

## 2023-04-18 VITALS — BP 115/61 | HR 84 | Wt 181.0 lb

## 2023-04-18 DIAGNOSIS — Z3A28 28 weeks gestation of pregnancy: Secondary | ICD-10-CM

## 2023-04-18 DIAGNOSIS — Z130 Encounter for screening for diseases of the blood and blood-forming organs and certain disorders involving the immune mechanism: Secondary | ICD-10-CM

## 2023-04-18 DIAGNOSIS — Z131 Encounter for screening for diabetes mellitus: Secondary | ICD-10-CM

## 2023-04-18 DIAGNOSIS — Z3402 Encounter for supervision of normal first pregnancy, second trimester: Secondary | ICD-10-CM

## 2023-04-18 DIAGNOSIS — Z369 Encounter for antenatal screening, unspecified: Secondary | ICD-10-CM

## 2023-04-18 DIAGNOSIS — O360130 Maternal care for anti-D [Rh] antibodies, third trimester, not applicable or unspecified: Secondary | ICD-10-CM

## 2023-04-18 DIAGNOSIS — Z23 Encounter for immunization: Secondary | ICD-10-CM | POA: Diagnosis not present

## 2023-04-18 DIAGNOSIS — E66811 Obesity, class 1: Secondary | ICD-10-CM

## 2023-04-18 DIAGNOSIS — Z3403 Encounter for supervision of normal first pregnancy, third trimester: Secondary | ICD-10-CM

## 2023-04-18 DIAGNOSIS — Z113 Encounter for screening for infections with a predominantly sexual mode of transmission: Secondary | ICD-10-CM

## 2023-04-18 DIAGNOSIS — Z6791 Unspecified blood type, Rh negative: Secondary | ICD-10-CM

## 2023-04-18 MED ORDER — RHO D IMMUNE GLOBULIN 1500 UNIT/2ML IJ SOSY
300.0000 ug | PREFILLED_SYRINGE | Freq: Once | INTRAMUSCULAR | Status: AC
  Administered 2023-04-18: 300 ug via INTRAMUSCULAR

## 2023-04-18 MED ORDER — HYDROCORTISONE 1 % EX OINT
1.0000 | TOPICAL_OINTMENT | Freq: Two times a day (BID) | CUTANEOUS | 1 refills | Status: DC
Start: 2023-04-18 — End: 2023-06-13

## 2023-04-18 NOTE — Assessment & Plan Note (Signed)
-   Rhogam today

## 2023-04-18 NOTE — Patient Instructions (Signed)
Gestational Diabetes Mellitus, Diagnosis Gestational diabetes mellitus, or gestational diabetes, is diabetes that some people get when pregnant. This condition usually happens at 24-28 weeks of pregnancy.  People with gestational diabetes have high blood sugar. If you do not get treated for this condition, it may cause problems for you and your baby. It goes away after you give birth but can happen again the next time you become pregnant. What are the causes? This condition is caused by changes in your body when you're pregnant. When these changes happen: The pancreas may not make enough insulin. The body may not use insulin in the right way. This is called insulin resistance. Insulin helps your body use sugar for energy. If your body doesn't have enough insulin or can't use the insulin that it has, extra sugars stay in your blood. This leads to high blood sugar (hyperglycemia). What increases the risk? Being older than age 15 when pregnant. Too much body weight. Having polycystic ovary syndrome (PCOS). Having someone with diabetes in your family. Having had this condition in the past. Being pregnant with more than one baby. What are the signs or symptoms? You may not have any symptoms. If you do have symptoms, they may include: Being thirsty often. Being hungry often. Needing to pee more often. These symptoms can be missed because they're similar to other symptoms of pregnancy. How is this diagnosed? This condition may be diagnosed based on your blood sugar level and how your body responds to glucose. This may be checked with an oral glucose tolerance test (OGTT). The test may be done: Early in pregnancy if you have risk factors. At 24-28 weeks of your pregnancy. How is this treated? To treat this condition, you may be told to: Eat a healthy diet. Get more exercise. Check your blood sugar often. Your health care provider will tell you what your target is. Take insulin and other  medicines. These are taken if needed. Work with a diabetes expert. Follow these instructions at home: Learn about your diabetes Ask your provider: How often should I check my blood sugar? Where do I get the equipment? What medicines do I need? When should I take them? Do I need to meet with an educator? Who can I call if I have questions? Where can I find a support group? General instructions Take medicines only as told by your provider. Stay at a healthy weight. Drink enough fluid to keep your pee (urine) pale yellow. Check your pee for ketones when sick and as told. Ketones in your pee is a sign that your body is using fat for energy because it's not making enough insulin. Wear an alert bracelet or carry a card that shows you have this condition. Keep all follow-up visits. Your provider needs to check your health and your baby's growth. Where to find more information Gestational Diabetes: American Diabetes Association (ADA): diabetes.org Gestational Diabetes and Pregnancy: Centers for Disease Control and Prevention (CDC): TonerPromos.no American Pregnancy Association: americanpregnancy.org [place] MyPlate: WrestlingReporter.dk Contact a health care provider if:  Your blood sugar is above your target for two tests in a row. You have a high blood sugar level and you also have ketones in your pee. You have been sick or have had a fever for 2 days or more and aren't getting better. You have any of these problems for more than 6 hours: You can't eat or drink. You vomit or feel like you may vomit. You have watery poop (diarrhea). Get help right away if:  You become confused or cannot think clearly. You have trouble breathing. You have chest pain. Your baby is moving less than usual. You have unusual discharge or bleeding from your vagina. You have cramping in your belly or have pain in your hips or lower back. You have symptoms of high blood pressure or preeclampsia. These include: A severe,  throbbing headache that doesn't go away. Sudden or extreme swelling of your face, hands, legs, or feet. Vision problems, such as: Seeing spots. Blurry vision. Sensitivity to light. These symptoms may be an emergency. Get help right away. Call 911. Do not wait to see if the symptoms will go away. Do not drive yourself to the hospital. This information is not intended to replace advice given to you by your health care provider. Make sure you discuss any questions you have with your health care provider. Document Revised: 08/05/2022 Document Reviewed: 08/05/2022 Elsevier Patient Education  2024 [employer].  Third Trimester of Pregnancy  The third trimester of pregnancy is from week 28 through week 40. This is months 7 through 9. The third trimester is a time when the unborn baby (fetus) is growing rapidly. At the end of the ninth month, the fetus is about 20 inches long and weighs 6-10 pounds. Body changes during your third trimester During the third trimester, your body will continue to go through many changes. The changes vary and generally return to normal after your baby is born. Physical changes Your weight will continue to increase. You can expect to gain 25-35 pounds (11-16 kg) by the end of the pregnancy if you begin pregnancy at a normal weight. If you are underweight, you can expect to gain 28-40 lb (about 13-18 kg), and if you are overweight, you can expect to gain 15-25 lb (about 7-11 kg). You may begin to get stretch marks on your hips, abdomen, and breasts. Your breasts will continue to grow and may hurt. A yellow fluid (colostrum) may leak from your breasts. This is the first milk you are producing for your baby. You may have changes in your hair. These can include thickening of your hair, rapid growth, and changes in texture. Some people also have hair loss during or after pregnancy, or hair that feels dry or thin. Your belly button may stick out. You may notice more swelling  in your hands, face, or ankles. Health changes You may have heartburn. You may have constipation. You may develop hemorrhoids. You may develop swollen, bulging veins (varicose veins) in your legs. You may have increased body aches in the pelvis, back, or thighs. This is due to weight gain and increased hormones that are relaxing your joints. You may have increased tingling or numbness in your hands, arms, and legs. The skin on your abdomen may also feel numb. You may feel short of breath because of your expanding uterus. Other changes You may urinate more often because the fetus is moving lower into your pelvis and pressing on your bladder. You may have more problems sleeping. This may be caused by the size of your abdomen, an increased need to urinate, and an increase in your body's metabolism. You may notice the fetus "dropping," or moving lower in your abdomen (lightening). You may have increased vaginal discharge. You may notice that you have pain around your pelvic bone as your uterus distends. Follow these instructions at home: Medicines Follow your health care provider's instructions regarding medicine use. Specific medicines may be either safe or unsafe to take during pregnancy. Do  not take any medicines unless approved by your health care provider. Take a prenatal vitamin that contains at least 600 micrograms (mcg) of folic acid. Eating and drinking Eat a healthy diet that includes fresh fruits and vegetables, whole grains, good sources of protein such as meat, eggs, or tofu, and low-fat dairy products. Avoid raw meat and unpasteurized juice, milk, and cheese. These carry germs that can harm you and your baby. Eat 4 or 5 small meals rather than 3 large meals a day. You may need to take these actions to prevent or treat constipation: Drink enough fluid to keep your urine pale yellow. Eat foods that are high in fiber, such as beans, whole grains, and fresh fruits and  vegetables. Limit foods that are high in fat and processed sugars, such as fried or sweet foods. Activity Exercise only as directed by your health care provider. Most people can continue their usual exercise routine during pregnancy. Try to exercise for 30 minutes at least 5 days a week. Stop exercising if you experience contractions in the uterus. Stop exercising if you develop pain or cramping in the lower abdomen or lower back. Avoid heavy lifting. Do not exercise if it is very hot or humid or if you are at a high altitude. If you choose to, you may continue to have sex unless your health care provider tells you not to. Relieving pain and discomfort Take frequent breaks and rest with your legs raised (elevated) if you have leg cramps or low back pain. Take warm sitz baths to soothe any pain or discomfort caused by hemorrhoids. Use hemorrhoid cream if your health care provider approves. Wear a supportive bra to prevent discomfort from breast tenderness. If you develop varicose veins: Wear support hose as told by your health care provider. Elevate your feet for 15 minutes, 3-4 times a day. Limit salt in your diet. Safety Talk to your health care provider before traveling far distances. Do not use hot tubs, steam rooms, or saunas. Wear your seat belt at all times when driving or riding in a car. Talk with your health care provider if someone is verbally or physically abusive to you. Preparing for birth To prepare for the arrival of your baby: Take prenatal classes to understand, practice, and ask questions about labor and delivery. Visit the hospital and tour the maternity area. Purchase a rear-facing car seat and make sure you know how to install it in your car. Prepare the baby's room or sleeping area. Make sure to remove all pillows and stuffed animals from the baby's crib to prevent suffocation. General instructions Avoid cat litter boxes and soil used by cats. These carry germs that  can cause birth defects in the baby. If you have a cat, ask someone to clean the litter box for you. Do not douche or use tampons. Do not use scented sanitary pads. Do not use any products that contain nicotine or tobacco, such as cigarettes, e-cigarettes, and chewing tobacco. If you need help quitting, ask your health care provider. Do not use any herbal remedies, illegal drugs, or medicines that were not prescribed to you. Chemicals in these products can harm your baby. Do not drink alcohol. You will have more frequent prenatal exams during the third trimester. During a routine prenatal visit, your health care provider will do a physical exam, perform tests, and discuss your overall health. Keep all follow-up visits. This is important. Where to find more information American Pregnancy Association: americanpregnancy.org [employer] of Obstetricians and  Gynecologists: https://www.todd-brady.net/ Office on Women's Health: MightyReward.co.nz Contact a health care provider if you have: A fever. Mild pelvic cramps, pelvic pressure, or nagging pain in your abdominal area or lower back. Vomiting or diarrhea. Bad-smelling vaginal discharge or foul-smelling urine. Pain when you urinate. A headache that does not go away when you take medicine. Visual changes or see spots in front of your eyes. Get help right away if: Your water breaks. You have regular contractions less than 5 minutes apart. You have spotting or bleeding from your vagina. You have severe abdominal pain. You have difficulty breathing. You have chest pain. You have fainting spells. You have not felt your baby move for the time period told by your health care provider. You have new or increased pain, swelling, or redness in an arm or leg. Summary The third trimester of pregnancy is from week 28 through week 40 (months 7 through 9). You may have more problems sleeping. This can be caused by the size of your  abdomen, an increased need to urinate, and an increase in your body's metabolism. You will have more frequent prenatal exams during the third trimester. Keep all follow-up visits. This is important. This information is not intended to replace advice given to you by your health care provider. Make sure you discuss any questions you have with your health care provider. Document Revised: 10/02/2019 Document Reviewed: 08/08/2019 Elsevier Patient Education  2024 [employer].

## 2023-04-18 NOTE — Assessment & Plan Note (Signed)
-  Rash-atopic dermatitis suspected, hydrocortisone ointment prescribed. -Reviewed kick counts and preterm labor warning signs. Instructed to call office or come to hospital with persistent headache, vision changes, regular contractions, leaking of fluid, decreased fetal movement or vaginal bleeding.  -GDM screening today -Growth ultrasound in 3rd trimester ordered for BMI>30

## 2023-04-18 NOTE — Progress Notes (Signed)
    Return Prenatal Note   Subjective   22 y.o. G1P0 at [redacted]w[redacted]d presents for this follow-up prenatal visit.  Patient reports intermittent rash, was on legs & breasts last week, wrist today. 28w labs today. Plans epidural in labor, will have mother with her for support. Unsure on contraceptive plan. Continues to use zofran to manage daily nausea, feels appetite is improving. Patient reports: Movement: Present Contractions: Not present  Objective   Flow sheet Vitals: Pulse Rate: 84 BP: 115/61 Fundal Height: 28 cm Fetal Heart Rate (bpm): 145 Total weight gain: 21 lb (9.526 kg)  General Appearance  No acute distress, well appearing, and well nourished Pulmonary   Normal work of breathing Neurologic   Alert and oriented to person, place, and time Psychiatric   Mood and affect within normal limits  Assessment/Plan   Plan  22 y.o. G1P0 at [redacted]w[redacted]d presents for follow-up OB visit. Reviewed prenatal record including previous visit note.  Supervision of normal pregnancy -Rash-atopic dermatitis suspected, hydrocortisone ointment prescribed. -Reviewed kick counts and preterm labor warning signs. Instructed to call office or come to hospital with persistent headache, vision changes, regular contractions, leaking of fluid, decreased fetal movement or vaginal bleeding.  -GDM screening today -Growth ultrasound in 3rd trimester ordered for BMI>30  Rh negative state in antepartum period -Rhogam today      Orders Placed This Encounter  Procedures   US OB Follow Up    Standing Status:   Future    Standing Expiration Date:   07/17/2023    Order Specific Question:   Reason for exam:    Answer:   follow up growth, obesity in pregnancy    Order Specific Question:   Preferred imaging location?    Answer:   [facility]   Tdap vaccine greater than or equal to 7yo IM   Return in 2 weeks (on 05/02/2023) for ROB.   No future appointments.  For next visit:  continue with routine prenatal  care     Dominica Severin, CNM  04/17/2409:12 AM

## 2023-04-19 LAB — 28 WEEKS RH-PANEL
Antibody Screen: NEGATIVE
Basophils Absolute: 0 10*3/uL (ref 0.0–0.2)
Basos: 0 %
EOS (ABSOLUTE): 0.1 10*3/uL (ref 0.0–0.4)
Eos: 1 %
Gestational Diabetes Screen: 90 mg/dL (ref 70–139)
HIV Screen 4th Generation wRfx: NONREACTIVE
Hematocrit: 31.7 % — ABNORMAL LOW (ref 34.0–46.6)
Hemoglobin: 10.3 g/dL — ABNORMAL LOW (ref 11.1–15.9)
Immature Grans (Abs): 0.1 10*3/uL (ref 0.0–0.1)
Immature Granulocytes: 1 %
Lymphocytes Absolute: 2.4 10*3/uL (ref 0.7–3.1)
Lymphs: 16 %
MCH: 29.8 pg (ref 26.6–33.0)
MCHC: 32.5 g/dL (ref 31.5–35.7)
MCV: 92 fL (ref 79–97)
Monocytes Absolute: 0.7 10*3/uL (ref 0.1–0.9)
Monocytes: 4 %
Neutrophils Absolute: 12.1 10*3/uL — ABNORMAL HIGH (ref 1.4–7.0)
Neutrophils: 78 %
Platelets: 298 10*3/uL (ref 150–450)
RBC: 3.46 x10E6/uL — ABNORMAL LOW (ref 3.77–5.28)
RDW: 12 % (ref 11.7–15.4)
RPR Ser Ql: NONREACTIVE
WBC: 15.5 10*3/uL — ABNORMAL HIGH (ref 3.4–10.8)

## 2023-04-23 ENCOUNTER — Ambulatory Visit
Admission: EM | Admit: 2023-04-23 | Discharge: 2023-04-23 | Disposition: A | Discharge status: Home / Self Care | Payer: MEDICAID | Attending: Emergency Medicine | Admitting: Emergency Medicine

## 2023-04-23 DIAGNOSIS — J069 Acute upper respiratory infection, unspecified: Secondary | ICD-10-CM | POA: Insufficient documentation

## 2023-04-23 DIAGNOSIS — R112 Nausea with vomiting, unspecified: Secondary | ICD-10-CM | POA: Insufficient documentation

## 2023-04-23 DIAGNOSIS — J029 Acute pharyngitis, unspecified: Secondary | ICD-10-CM | POA: Diagnosis present

## 2023-04-23 LAB — POC COVID19/FLU A&B COMBO
Covid Antigen, POC: NEGATIVE
Influenza A Antigen, POC: NEGATIVE
Influenza B Antigen, POC: NEGATIVE

## 2023-04-23 LAB — POCT RAPID STREP A (OFFICE): Rapid Strep A Screen: NEGATIVE

## 2023-04-23 MED ORDER — ONDANSETRON HCL 4 MG PO TABS: 4.0000 mg | ORAL_TABLET | Freq: Four times a day (QID) | ORAL | 0 refills | Status: DC

## 2023-04-23 MED ORDER — ONDANSETRON 4 MG PO TBDP
4.0000 mg | ORAL_TABLET | Freq: Once | ORAL | Status: AC
  Administered 2023-04-23: 4 mg via ORAL

## 2023-04-23 NOTE — ED Triage Notes (Signed)
Patient presents with headache, congestion, cough, chills and sore throat x day 2. Treated with Tylenol cold and flu and Theraflu with minimal relief.

## 2023-04-23 NOTE — ED Provider Notes (Signed)
[facility]   [id number] 04/23/23 Arrival Time: 1059   CC: Cold symptoms  SUBJECTIVE: History from: patient and family.  Robin Pineda is a 22 y.o. female who is expecting presents to the urgent care with a complaint of cough with nausea and vomiting, congestion, sore throat,  headache for the past 2 days.  Denies sick exposure to COVID, flu or strep.  Denies recent travel.  Has tried OTC medication  without relief.  Denies any alleviating or aggravating factors.  Did not previous symptoms in the past.   Denies fever, chills, fatigue, sinus pain, rhinorrhea, sore throat, SOB, wheezing, chest pain, nausea, changes in bowel or bladder habits.    ROS: As per HPI.  All other pertinent ROS negative.      Past Medical History:  Diagnosis Date   Anxiety    Arm fracture, left    Arm fracture, right    Asthma    Broken nose    Depression    Enlarged tonsils    Headache    POTS (postural orthostatic tachycardia syndrome)    Seasonal allergies    UTI (urinary tract infection)    Past Surgical History:  Procedure Laterality Date   OTHER SURGICAL HISTORY Right 2011   Pins placed to repair break in right arm   TONSILECTOMY, ADENOIDECTOMY, BILATERAL MYRINGOTOMY AND TUBES     TONSILLECTOMY     Allergies  Allergen Reactions   Cinnamon Hives   Cinnamon Swelling   No current facility-administered medications on file prior to encounter.   Current Outpatient Medications on File Prior to Encounter  Medication Sig Dispense Refill   albuterol (VENTOLIN HFA) 108 (90 Base) MCG/ACT inhaler Inhale 1-2 puffs into the lungs every 6 (six) hours as needed. 8 g 0   Doxylamine-Pyridoxine (DICLEGIS) 10-10 MG TBEC Take 4 tablets by mouth daily. Take 2 tablets at bedtime, add morning dose of 1 tablet if nausea persists after 2 days, add afternoon dose of 1 tablet if nausea persists after another 2 days. 60 tablet 2   famotidine (PEPCID) 20 MG tablet Take 1 tablet (20 mg total) by mouth 2  (two) times daily. 60 tablet 3   hydrocortisone 1 % ointment Apply 1 Application topically 2 (two) times daily. Apply thin layer to affected area twice daily. 56 g 1   metoCLOPramide (REGLAN) 10 MG tablet Take 1 tablet (10 mg total) by mouth 3 (three) times daily before meals. 30 tablet 0   ondansetron (ZOFRAN-ODT) 4 MG disintegrating tablet Take 1 tablet (4 mg total) by mouth every 8 (eight) hours as needed for nausea or vomiting. 30 tablet 3   Prenat-FeCbn-FeAsp-Meth-FA-DHA (PRENATE MINI) 18-0.6-0.4-350 MG CAPS Take 1 tablet by mouth daily. 90 capsule 4   [DISCONTINUED] atenolol (TENORMIN) 25 MG tablet Take 25 mg by mouth daily.     [DISCONTINUED] FLUoxetine (PROZAC) 20 MG capsule Take 1 capsule (20 mg total) by mouth daily. 30 capsule 0   [DISCONTINUED] fluticasone (FLONASE) 50 MCG/ACT nasal spray Place 1 spray into both nostrils daily.     [DISCONTINUED] Norethindrone Acetate-Ethinyl Estrad-FE (LOESTRIN 24 FE) 1-20 MG-MCG(24) tablet Take 1 tablet by mouth daily. (Patient not taking: Reported on 09/05/2018) 3 Package 3   Social History   Socioeconomic History   Marital status: Single    Spouse name: Not on file   Number of children: 0   Years of education: 9   Highest education level: Not on file  Occupational History   Occupation: working on [employer]  Tobacco Use   Smoking status: Former    Current packs/day: 0.00    Types: Cigarettes    Quit date: 10/2022    Years since quitting: 0.5   Smokeless tobacco: Never   Tobacco comments:    Trying to stop using patches  Vaping Use   Vaping status: Some Days   Substances: Nicotine, THC, Flavoring   Devices: 11/25/22 trying to quit  Substance and Sexual Activity   Alcohol use: No   Drug use: Yes    Frequency: 7.0 times per week    Types: Marijuana    Comment: daily marijuana, 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   Sexual activity: Yes    Partners: Male    Birth control/protection: None  Other Topics Concern   Not on file  Social History Narrative    Not on file   Social Drivers of Health   Financial Resource Strain: Medium Risk (11/25/2022)   Overall Financial Resource Strain (CARDIA)    Difficulty of Paying Living Expenses: Somewhat hard  Food Insecurity: Food Insecurity Present (11/25/2022)   Hunger Vital Sign    Worried About Running Out of Food in the Last Year: Sometimes true    Ran Out of Food in the Last Year: Never true  Transportation Needs: No Transportation Needs (11/25/2022)   PRAPARE - Administrator, Civil Service (Medical): No    Lack of Transportation (Non-Medical): No  Physical Activity: Insufficiently Active (11/25/2022)   Exercise Vital Sign    Days of Exercise per Week: 4 days    Minutes of Exercise per Session: 30 min  Stress: No Stress Concern Present (11/25/2022)   [employer] of Occupational Health - Occupational Stress Questionnaire    Feeling of Stress : Not at all  Social Connections: Unknown (11/25/2022)   Social Connection and Isolation Panel [NHANES]    Frequency of Communication with Friends and Family: More than three times a week    Frequency of Social Gatherings with Friends and Family: Three times a week    Attends Religious Services: More than 4 times per year    Active Member of Clubs or Organizations: No    Attends Banker Meetings: Never    Marital Status: Not on file  Intimate Partner Violence: Not At Risk (11/25/2022)   Humiliation, Afraid, Rape, and Kick questionnaire    Fear of Current or Ex-Partner: No    Emotionally Abused: No    Physically Abused: No    Sexually Abused: No   Family History  Problem Relation Age of Onset   Lupus Mother    Heart failure Father    Diabetes Father    Hypertension Father    Healthy Brother    Other Brother        cardiomyopathy; heart block   Healthy Maternal Grandmother    Healthy Maternal Grandfather    Other Paternal Grandmother        cardiomyopathy   Healthy Paternal Grandfather     OBJECTIVE:  Vitals:    04/23/23 1123 04/23/23 1124  BP:  136/76  Pulse:  83  Resp:  16  Temp:  98.9 F (37.2 C)  TempSrc:  Oral  SpO2:  98%  Weight: 180 lb (81.6 kg)   Height: 5\' 2"  (1.575 m)      General appearance: alert; appears fatigued, but nontoxic; speaking in full sentences and tolerating own secretions HEENT: NCAT; Ears: EACs clear, TMs pearly gray; Eyes: PERRL.  EOM grossly intact. Sinuses: nontender; Nose: nares patent  without rhinorrhea, Throat: oropharynx clear, tonsils non erythematous or enlarged, uvula midline  Neck: supple without LAD Lungs: unlabored respirations, symmetrical air entry; cough: mild; no respiratory distress; CTAB Heart: regular rate and rhythm.  Radial pulses 2+ symmetrical bilaterally Skin: warm and dry Psychological: alert and cooperative; normal mood and affect  LABS:  Results for orders placed or performed during the hospital encounter of 04/23/23 (from the past 24 hours)  POC Covid19/Flu A&B Antigen     Status: None   Collection Time: 04/23/23 11:38 AM  Result Value Ref Range   Influenza A Antigen, POC Negative Negative   Influenza B Antigen, POC Negative Negative   Covid Antigen, POC Negative Negative  POCT rapid strep A     Status: Normal   Collection Time: 04/23/23 11:41 AM  Result Value Ref Range   Rapid Strep A Screen Negative      ASSESSMENT & PLAN:  1. Sore throat   2. Nausea and vomiting, unspecified vomiting type   3. URI with cough and congestion     Meds ordered this encounter  Medications   ondansetron (ZOFRAN-ODT) disintegrating tablet 4 mg   ondansetron (ZOFRAN) 4 MG tablet    Sig: Take 1 tablet (4 mg total) by mouth every 6 (six) hours.    Dispense:  12 tablet    Refill:  0   Discharge instructions  Get plenty of rest and push fluids Safe pregnancy medication list was given  May use these medications  for symptom relief Use OTC medications like  tylenol as needed fever or pain Follow-up with OB/GYN Call or go to the ED if you  have any new or worsening symptoms such as fever, worsening cough, shortness of breath, chest tightness, chest pain, turning blue, changes in mental status, etc...   Reviewed expectations re: course of current medical issues. Questions answered. Outlined signs and symptoms indicating need for more acute intervention. Patient verbalized understanding. After Visit Summary given.          Durward Parcel, FNP 04/23/23 [phone]

## 2023-04-23 NOTE — Discharge Instructions (Signed)
Get plenty of rest and push fluids Safe pregnancy medication list was given  May use these medications  for symptom relief Use OTC medications like  tylenol as needed fever or pain Follow-up with OB/GYN Call or go to the ED if you have any new or worsening symptoms such as fever, worsening cough, shortness of breath, chest tightness, chest pain, turning blue, changes in mental status, etc..Marland Kitchen

## 2023-04-26 LAB — CULTURE, GROUP A STREP (THRC)

## 2023-05-01 ENCOUNTER — Ambulatory Visit (INDEPENDENT_AMBULATORY_CARE_PROVIDER_SITE_OTHER): Payer: MEDICAID | Admitting: Obstetrics

## 2023-05-01 ENCOUNTER — Encounter: Payer: Self-pay | Admitting: Obstetrics

## 2023-05-01 VITALS — BP 126/62 | HR 76 | Wt 181.3 lb

## 2023-05-01 DIAGNOSIS — Z3A29 29 weeks gestation of pregnancy: Secondary | ICD-10-CM

## 2023-05-01 DIAGNOSIS — Z3403 Encounter for supervision of normal first pregnancy, third trimester: Secondary | ICD-10-CM

## 2023-05-01 NOTE — Progress Notes (Signed)
Routine Prenatal Care Visit  Subjective  Robin Pineda is a 22 y.o. G1P0 at [redacted]w[redacted]d being seen today for ongoing prenatal care.  She is currently monitored for the following issues for this low-risk pregnancy and has Migraine without aura and without status migrainosus, not intractable; Episodic tension-type headache; Obesity; Disequilibrium; MDD (major depressive disorder), recurrent, severe, with psychosis (HCC); Supervision of normal pregnancy; Bipolar disorder (HCC); and Rh negative state in antepartum period on their problem list.  ----------------------------------------------------------------------------------- Patient reports nausea.  She has continued to have N and V during this pregnancy. This morning ate [employer], then threw up.  Has not consistently taken the prescribed Reglan. She also reports that her breasts are leaking. Contractions: Not present. Vag. Bleeding: None.  Movement: Present. Leaking Fluid denies.  ----------------------------------------------------------------------------------- The following portions of the patient's history were reviewed and updated as appropriate: allergies, current medications, past family history, past medical history, past social history, past surgical history and problem list. Problem list updated.  Objective  Blood pressure 126/62, pulse 76, weight 181 lb 4.8 oz (82.2 kg), last menstrual period 10/04/2022. Pregravid weight 160 lb (72.6 kg) Total Weight Gain 21 lb 4.8 oz (9.662 kg) Urinalysis: Urine Protein    Urine Glucose    Fetal Status: Fetal Heart Rate (bpm): 138 Fundal Height: 28 cm Movement: Present  Presentation: Vertex  General:  Alert, oriented and cooperative. Patient is in no acute distress.  Skin: Skin is warm and dry. No rash noted.   Cardiovascular: Normal heart rate noted  Respiratory: Normal respiratory effort, no problems with respiration noted  Abdomen: Soft, gravid, appropriate for gestational age. Pain/Pressure: Absent      Pelvic:  Cervical exam deferred        Extremities: Normal range of motion.  Edema: None  Mental Status: Normal mood and affect. Normal behavior. Normal judgment and thought content.   Assessment   22 y.o. G1P0 at [redacted]w[redacted]d by  07/11/2023, Date entered prior to episode creation presenting for routine prenatal visit Nausea and vomiting in pregnancy. Mood issues. Not on any medication Plan   first Problems (from 11/24/22 to present)     Problem Noted Diagnosed Resolved   Supervision of normal pregnancy 11/25/2022 by Loran Senters, CMA  No   Overview Addendum 05/01/2023  8:20 AM by Tommie Raymond, CMA   Clinical Staff Provider  Office Location  [facility] Dating  07/11/2023, LMP  Language  English Anatomy US  normal  Flu Vaccine  declined Genetic Screen  NIPS: low risk  TDaP vaccine  04/18/23 Hgb A1C or  GTT Early : Third trimester :   Covid declined   LAB RESULTS   Rhogam  AB/Negative/-- (08/26 1157)  Blood Type AB/Negative/-- (08/26 1157)   Feeding Plan breast Antibody Negative (08/26 1157)  Contraception undecided Rubella 3.94 (08/26 1157)  Circumcision yes RPR Non Reactive (08/26 1157)   Pediatrician  In [place] HBsAg Negative (08/26 1157)   Support Person Adam: father of patient HIV Non Reactive (08/26 1157)  Prenatal Classes yes Varicella immune    GBS  (For PCN allergy, check sensitivities)   BTL Consent  Hep C Non Reactive (08/26 1157)   VBAC Consent  Pap No results found for: "DIAGPAP"    Hgb Electro      CF      SMA                    Preterm labor symptoms and general obstetric precautions including but not limited to vaginal  bleeding, contractions, leaking of fluid and fetal movement were reviewed in detail with the patient. Please refer to After Visit Summary for other counseling recommendations.  Reassured re the leaking from her breasts as normal (may be reglan related)  Return in about 2 weeks (around 05/15/2023) for return OB.  Mirna Mires,  CNM  05/01/2023 8:53 AM

## 2023-05-05 ENCOUNTER — Encounter: Payer: Self-pay | Admitting: Certified Nurse Midwife

## 2023-05-10 NOTE — L&D Delivery Note (Signed)
 Obstetrical Delivery Note   Date of Delivery:   07/01/2023 Primary OB:   [facility]  Gestational Age/EDD: [redacted]w[redacted]d (Dated by LMP) Reason for Admission: SROM with meconium Antepartum complications: anemia  Delivered By:   Cindra Eves, Student Midwife   Delivery Type:   spontaneous vaginal delivery  Delivery Details:   Robin Pineda was admitted with spontaneous rupture of membranes with meconium stained fluid in early labor. She was augmented with pitocin. She was found to be completely dilated at 0705 and began pushing around 0730. There was slow descent until around 0940. Pushing was paused for around 30 minutes at this time to allow for more passive descent, maternal rest, and uptitration of pitocin. Pushing resumed around 1012 due to [facility] reports of increasing rectal pressure. With great maternal effort, she birthed a vigorous female infant, Robin Pineda, at 27. The fetal head was born in the LOA position and restituted to LOT. The shoulders followed without difficulty. Robin Pineda was placed on the maternal abdomen and was dried and stimulated. Postpartum pitocin was started for active management of the third stage. After pulsation of the cord ceased, the cord was doubly clamped and cut. Cord blood for typing was obtained and sent to the lab. The placenta was birthed at 1 and was intact with meconium stained membranes and cord. A vaginal and rectal assessment revealed a 2nd degree laceration, which was repaired with vicryl suture. Robin Pineda and Robin Pineda were left in stable condition in the labor room.   Anesthesia:    epidural Intrapartum complications: None GBS:    Negative Laceration:    2nd degree Episiotomy:    none Rectal exam:   Intact  Placenta:    Delivered and expressed via active management. Intact: yes. To pathology: no.  Delayed Cord Clamping: yes Estimated Blood Loss:  400  Robin:    Liveborn female, APGARs 8/9, weight pending   Cindra Eves, Student Midwife Carie Caddy, CNM  present for all portions of care.

## 2023-05-17 ENCOUNTER — Ambulatory Visit: Payer: MEDICAID

## 2023-05-17 VITALS — BP 118/73 | HR 101 | Wt 190.0 lb

## 2023-05-17 DIAGNOSIS — Z3A32 32 weeks gestation of pregnancy: Secondary | ICD-10-CM

## 2023-05-17 DIAGNOSIS — Z23 Encounter for immunization: Secondary | ICD-10-CM | POA: Diagnosis not present

## 2023-05-17 DIAGNOSIS — Z2911 Encounter for prophylactic immunotherapy for respiratory syncytial virus (RSV): Secondary | ICD-10-CM

## 2023-05-17 DIAGNOSIS — O99019 Anemia complicating pregnancy, unspecified trimester: Secondary | ICD-10-CM | POA: Insufficient documentation

## 2023-05-17 DIAGNOSIS — O99013 Anemia complicating pregnancy, third trimester: Secondary | ICD-10-CM

## 2023-05-17 DIAGNOSIS — Z3403 Encounter for supervision of normal first pregnancy, third trimester: Secondary | ICD-10-CM

## 2023-05-17 HISTORY — DX: Anemia complicating pregnancy, unspecified trimester: O99.019

## 2023-05-17 NOTE — Progress Notes (Signed)
 RSV Vaccine administered 05/17/23.

## 2023-05-17 NOTE — Addendum Note (Signed)
 Addended by: Georgiana Shore R on: 05/17/2023 11:28 AM   Modules accepted: Orders

## 2023-05-17 NOTE — Assessment & Plan Note (Addendum)
-   RSV vaccination given today. - Discussed comfort measures for hip and rib pain.  - Reviewed policy and procedures for drug screening in hospital. Discussed that she will have a social work consult but the purpose would be to ensure she has resources and not to take the baby away for marijuana use.  - Reviewed kick counts and preterm labor warning signs. Instructed to call office or come to hospital with persistent headache, vision changes, regular contractions, leaking of fluid, decreased fetal movement or vaginal bleeding.

## 2023-05-17 NOTE — Progress Notes (Signed)
    Return Prenatal Note   Assessment/Plan   Plan  23 y.o. G1P0 at [redacted]w[redacted]d presents for follow-up OB visit. Reviewed prenatal record including previous visit note.  Supervision of normal pregnancy - RSV vaccination given today. - Discussed comfort measures for hip and rib pain.  - Reviewed policy and procedures for drug screening in hospital. Discussed that she will have a social work consult but the purpose would be to ensure she has resources and not to take the baby away for marijuana use.  - Reviewed kick counts and preterm labor warning signs. Instructed to call office or come to hospital with persistent headache, vision changes, regular contractions, leaking of fluid, decreased fetal movement or vaginal bleeding.   No orders of the defined types were placed in this encounter.  No follow-ups on file.   Future Appointments  Date Time Provider Department Center  05/19/2023  9:15 AM [facility] US  1 AOB-[facility] None    For next visit:  continue with routine prenatal care     Subjective   22 y.o. G1P0 at [redacted]w[redacted]d presents for this follow-up prenatal visit.  Patient has ongoing hip pain which she has a cortisone shot approved for but has not gotten yet. Also feeling more pain in ribs. Has questions about what will happen in hospital due to marijuana use.  Patient reports: Movement: Present Contractions: Irritability  Objective   Flow sheet Vitals: Pulse Rate: (!) 101 BP: 118/73 Fundal Height: 31 cm Fetal Heart Rate (bpm): 130 Presentation: Vertex Total weight gain: 30 lb (13.6 kg)  General Appearance  No acute distress, well appearing, and well nourished Pulmonary   Normal work of breathing Neurologic   Alert and oriented to person, place, and time Psychiatric   Mood and affect within normal limits  Lauraine PARAS Renn Stille, CNM  05/16/2509:24 AM

## 2023-05-18 ENCOUNTER — Telehealth: Payer: Self-pay

## 2023-05-18 NOTE — Telephone Encounter (Signed)
 Patient reports she has had cramps and loose stool since yesterday. She reports good fetal movement. Denies lof, states she has had braxton hicks contractions. She reports her stomach cramps were once every 20-30 minutes. She had a chicken biscuit yesterday which is not normal for her to eat. She is uncertain if this upset her stomach. Advised uncertain if cramps are from bowels issues or possible food related. She has an appointment for ultrasound tomorrow at 9 am. Advised to report to ED if having more than four contractions per hour, vaginal bleeding/leaking of fluid or decreased fetal movement.

## 2023-05-19 ENCOUNTER — Ambulatory Visit (INDEPENDENT_AMBULATORY_CARE_PROVIDER_SITE_OTHER): Payer: MEDICAID

## 2023-05-19 ENCOUNTER — Encounter: Payer: Self-pay | Admitting: Licensed Practical Nurse

## 2023-05-19 DIAGNOSIS — E66811 Obesity, class 1: Secondary | ICD-10-CM

## 2023-05-19 DIAGNOSIS — O99213 Obesity complicating pregnancy, third trimester: Secondary | ICD-10-CM

## 2023-05-19 DIAGNOSIS — Z3A33 33 weeks gestation of pregnancy: Secondary | ICD-10-CM | POA: Diagnosis not present

## 2023-05-19 DIAGNOSIS — Z3403 Encounter for supervision of normal first pregnancy, third trimester: Secondary | ICD-10-CM

## 2023-05-24 ENCOUNTER — Encounter (HOSPITAL_COMMUNITY): Payer: Self-pay | Admitting: Obstetrics and Gynecology

## 2023-05-24 ENCOUNTER — Inpatient Hospital Stay (HOSPITAL_COMMUNITY)
Admission: AD | Admit: 2023-05-24 | Discharge: 2023-05-24 | Disposition: A | Discharge status: Home / Self Care | Payer: MEDICAID | Attending: Obstetrics and Gynecology | Admitting: Obstetrics and Gynecology

## 2023-05-24 DIAGNOSIS — R109 Unspecified abdominal pain: Secondary | ICD-10-CM | POA: Diagnosis present

## 2023-05-24 DIAGNOSIS — N898 Other specified noninflammatory disorders of vagina: Secondary | ICD-10-CM | POA: Insufficient documentation

## 2023-05-24 DIAGNOSIS — O4703 False labor before 37 completed weeks of gestation, third trimester: Secondary | ICD-10-CM | POA: Diagnosis not present

## 2023-05-24 DIAGNOSIS — R519 Headache, unspecified: Secondary | ICD-10-CM | POA: Diagnosis not present

## 2023-05-24 DIAGNOSIS — Z3A33 33 weeks gestation of pregnancy: Secondary | ICD-10-CM | POA: Diagnosis not present

## 2023-05-24 DIAGNOSIS — O26893 Other specified pregnancy related conditions, third trimester: Secondary | ICD-10-CM | POA: Diagnosis present

## 2023-05-24 DIAGNOSIS — Z3493 Encounter for supervision of normal pregnancy, unspecified, third trimester: Secondary | ICD-10-CM

## 2023-05-24 LAB — URINALYSIS, ROUTINE W REFLEX MICROSCOPIC
Bilirubin Urine: NEGATIVE
Glucose, UA: NEGATIVE mg/dL
Hgb urine dipstick: NEGATIVE
Ketones, ur: NEGATIVE mg/dL
Nitrite: NEGATIVE
Protein, ur: NEGATIVE mg/dL
Specific Gravity, Urine: 1.021 (ref 1.005–1.030)
pH: 5 (ref 5.0–8.0)

## 2023-05-24 LAB — RUPTURE OF MEMBRANE (ROM)PLUS: Rom Plus: NEGATIVE

## 2023-05-24 LAB — WET PREP, GENITAL
Clue Cells Wet Prep HPF POC: NONE SEEN
Sperm: NONE SEEN
Trich, Wet Prep: NONE SEEN
WBC, Wet Prep HPF POC: >=10 — AB (ref <10)
Yeast Wet Prep HPF POC: NONE SEEN

## 2023-05-24 LAB — GC/CHLAMYDIA PROBE AMP (~~LOC~~) NOT AT ARMC
Chlamydia: NEGATIVE
Comment: NEGATIVE
Comment: NORMAL
Neisseria Gonorrhea: NEGATIVE

## 2023-05-24 LAB — POCT FERN TEST: POCT Fern Test: NEGATIVE

## 2023-05-24 MED ORDER — BUTALBITAL-APAP-CAFFEINE 50-325-40 MG PO TABS
1.0000 | ORAL_TABLET | Freq: Once | ORAL | Status: AC
  Administered 2023-05-24: 1 via ORAL
  Filled 2023-05-24: qty 1

## 2023-05-24 MED ORDER — METOCLOPRAMIDE HCL 10 MG PO TABS
10.0000 mg | ORAL_TABLET | Freq: Once | ORAL | Status: AC
  Administered 2023-05-24: 10 mg via ORAL
  Filled 2023-05-24: qty 1

## 2023-05-24 MED ORDER — DIPHENHYDRAMINE HCL 25 MG PO CAPS
25.0000 mg | ORAL_CAPSULE | Freq: Once | ORAL | Status: AC
Start: 2023-05-24 — End: 2023-05-24
  Administered 2023-05-24: 25 mg via ORAL
  Filled 2023-05-24: qty 1

## 2023-05-24 NOTE — MAU Note (Addendum)
.  Robin Pineda is a 23 y.o. at [redacted]w[redacted]d here in MAU reporting lower back pain since yesterday. REports a h/a since this am. Having watery d/c over the past wk.which is increasing. REports good FM and no VB. Took Tylenol  at 2200 for h/a but did not help. Unsure of how much she took   Onset of complaint: yesterday Pain score: 7 Vitals:   05/24/23 0010 05/24/23 0013  BP:  125/73  Pulse: 97   Resp: 18   Temp: (!) 97.5 F (36.4 C)   SpO2: 99%      FHT:145 Lab orders placed from triage: u/a

## 2023-05-24 NOTE — MAU Provider Note (Signed)
Chief Complaint:  Abdominal Pain, Back Pain, and Vaginal Discharge   HPI   Robin Pineda is a 23 y.o. G1P0 at [redacted]w[redacted]d who presents to maternity admissions reporting numerous c/o consisting of  abdominal pain, increased vaginal discharge described per patient as "watery" and a HA 7/10 on pain scale not resolved by Tylenol last taken at 2200 and patient reports "Unsure of dosage". She does have a H/O Migraine HA's and episodic tension HA's on her problem list  Pregnancy Course: [facility]  Past Medical History:  Diagnosis Date   Anxiety    Arm fracture, left    Arm fracture, right    Asthma    Broken nose    Depression    Enlarged tonsils    Headache    POTS (postural orthostatic tachycardia syndrome)    Seasonal allergies    UTI (urinary tract infection)    OB History  Gravida Para Term Preterm AB Living  1       SAB IAB Ectopic Multiple Live Births          # Outcome Date GA Lbr Len/2nd Weight Sex Type Anes PTL Lv  1 Current            Past Surgical History:  Procedure Laterality Date   OTHER SURGICAL HISTORY Right 2011   Pins placed to repair break in right arm   TONSILECTOMY, ADENOIDECTOMY, BILATERAL MYRINGOTOMY AND TUBES     TONSILLECTOMY     Family History  Problem Relation Age of Onset   Lupus Mother    Heart failure Father    Diabetes Father    Hypertension Father    Healthy Brother    Other Brother        cardiomyopathy; heart block   Healthy Maternal Grandmother    Healthy Maternal Grandfather    Other Paternal Grandmother        cardiomyopathy   Healthy Paternal Grandfather    Social History   Tobacco Use   Smoking status: Former    Current packs/day: 0.00    Types: Cigarettes    Quit date: 10/2022    Years since quitting: 0.6   Smokeless tobacco: Never   Tobacco comments:    Trying to stop using patches  Vaping Use   Vaping status: Some Days   Substances: Nicotine, THC, Flavoring   Devices: 11/25/22 trying to quit   Substance Use Topics   Alcohol use: No   Drug use: Yes    Frequency: 7.0 times per week    Types: Marijuana    Comment: daily marijuana, 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    I have reviewed patient's Past Medical Hx, Surgical Hx, Family Hx, Social Hx, medications and allergies.   ROS  Pertinent items noted in HPI and remainder of comprehensive ROS otherwise negative.   PHYSICAL EXAM  Patient Vitals for the past 24 hrs:  BP Temp Pulse Resp SpO2 Height Weight  05/24/23 0300 123/61 -- 88 -- -- -- --  05/24/23 0033 (!) 123/58 -- 93 -- -- -- --  05/24/23 0013 125/73 -- -- -- -- -- --  05/24/23 0010 -- (!) 97.5 F (36.4 C) 97 18 99 % 5\' 2"  (1.575 m) 86.9 kg    Constitutional: Well-developed, well-nourished female in no acute distress. Appears uncomfortable Cardiovascular: normal rate & rhythm, warm and well-perfused Respiratory: normal effort, no problems  with respiration noted GI: Abd soft, non-tender, non-distended MS: Extremities nontender, no edema, normal ROM Neurologic: Alert and oriented x 4.  GU: no CVA tenderness Pelvic:   Dilation: Closed Effacement (%): Thick Station: Ballotable Exam by:: Makana Rostad NP  Fetal Tracing: Cat 1, Reactive Baseline: 130 Variability: moderate Accelerations: present Decelerations: absent Toco: uterine irritability   Labs: Results for orders placed or performed during the hospital encounter of 05/24/23 (from the past 24 hours)  Urinalysis, Routine w reflex microscopic -Urine, Clean Catch     Status: Abnormal   Collection Time: 05/24/23 12:20 AM  Result Value Ref Range   Color, Urine YELLOW YELLOW   APPearance HAZY (A) CLEAR   Specific Gravity, Urine 1.021 1.005 - 1.030   pH 5.0 5.0 - 8.0   Glucose, UA NEGATIVE NEGATIVE mg/dL   Hgb urine dipstick NEGATIVE NEGATIVE   Bilirubin Urine NEGATIVE NEGATIVE   Ketones, ur NEGATIVE NEGATIVE  mg/dL   Protein, ur NEGATIVE NEGATIVE mg/dL   Nitrite NEGATIVE NEGATIVE   Leukocytes,Ua MODERATE (A) NEGATIVE   RBC / HPF 0-5 0 - 5 RBC/hpf   WBC, UA 21-50 0 - 5 WBC/hpf   Bacteria, UA FEW (A) NONE SEEN   Squamous Epithelial / HPF 6-10 0 - 5 /HPF   Mucus PRESENT   POCT fern test     Status: Normal   Collection Time: 05/24/23 12:59 AM  Result Value Ref Range   POCT Fern Test Negative = intact amniotic membranes   Wet prep, genital     Status: Abnormal   Collection Time: 05/24/23  1:37 AM   Specimen: PATH Cytology Cervicovaginal Ancillary Only  Result Value Ref Range   Yeast Wet Prep HPF POC NONE SEEN NONE SEEN   Trich, Wet Prep NONE SEEN NONE SEEN   Clue Cells Wet Prep HPF POC NONE SEEN NONE SEEN   WBC, Wet Prep HPF POC >=10 (A) <10   Sperm NONE SEEN   Rupture of Membrane (ROM) Plus     Status: None   Collection Time: 05/24/23  1:37 AM  Result Value Ref Range   Rom Plus NEGATIVE     Imaging:  No results found.  MDM & MAU COURSE  MDM:  HIGH  - HA (Medications administered with resolve) now 4/10 on pain scale - SVE: Closed/Thick/High - CX's sent to r/o infection -  U/A with mod leucocytes/ Neg nitrites therefore Urine C/S sent - Fern Test negative ( ROM Plus sent) - NST: Cat 1, reactive  No evidence of active labor or PPROM at this time with resolve of HA Stable for Discharge home   MAU Course: Orders Placed This Encounter  Procedures   Wet prep, genital   Culture, OB Urine   Urinalysis, Routine w reflex microscopic -Urine, Clean Catch   Rupture of Membrane (ROM) Plus   POCT fern test   Discharge patient   Meds ordered this encounter  Medications   butalbital-acetaminophen-caffeine (FIORICET) 50-325-40 MG per tablet 1 tablet   metoCLOPramide (REGLAN) tablet 10 mg   diphenhydrAMINE (BENADRYL) capsule 25 mg    ASSESSMENT   1. Headache in pregnancy, antepartum, third trimester   2. False labor before 37 completed weeks of gestation in third trimester   3.  Vaginal discharge during pregnancy in third trimester   4. Intact amniotic membranes during pregnancy in third trimester   5. [redacted] weeks gestation of pregnancy     PLAN  Discharge home in stable condition with return precautions.  F/U as scheduled on  05/31/23 with Primary OB     Allergies as of 05/24/2023       Reactions   Cinnamon Hives   Cinnamon Swelling        Medication List     TAKE these medications    albuterol 108 (90 Base) MCG/ACT inhaler Commonly known as: VENTOLIN HFA Inhale 1-2 puffs into the lungs every 6 (six) hours as needed.   famotidine 20 MG tablet Commonly known as: PEPCID Take 1 tablet (20 mg total) by mouth 2 (two) times daily.   hydrocortisone 1 % ointment Apply 1 Application topically 2 (two) times daily. Apply thin layer to affected area twice daily.   metoCLOPramide 10 MG tablet Commonly known as: REGLAN Take 1 tablet (10 mg total) by mouth 3 (three) times daily before meals.   ondansetron 4 MG tablet Commonly known as: ZOFRAN Take 1 tablet (4 mg total) by mouth every 6 (six) hours.   Prenate Mini 18-0.6-0.4-350 MG Caps Take 1 tablet by mouth daily.       Marcell Barlow, MSN, [facility], [facility] [employer]

## 2023-05-25 LAB — CULTURE, OB URINE

## 2023-05-31 ENCOUNTER — Encounter: Payer: MEDICAID | Admitting: Certified Nurse Midwife

## 2023-06-05 ENCOUNTER — Telehealth: Payer: Self-pay

## 2023-06-05 NOTE — Telephone Encounter (Signed)
Pt left a voicemail stating she has been experiencing insomnia and restless legs for the last 3 days. She reports she has not been able to sleep. Please advise on any treatment ideas, thank you

## 2023-06-06 ENCOUNTER — Ambulatory Visit (INDEPENDENT_AMBULATORY_CARE_PROVIDER_SITE_OTHER): Payer: MEDICAID | Admitting: Certified Nurse Midwife

## 2023-06-06 VITALS — BP 121/73 | HR 84 | Temp 98.2°F | Wt 193.3 lb

## 2023-06-06 DIAGNOSIS — R1011 Right upper quadrant pain: Secondary | ICD-10-CM

## 2023-06-06 DIAGNOSIS — Z3A35 35 weeks gestation of pregnancy: Secondary | ICD-10-CM

## 2023-06-06 LAB — POCT URINALYSIS DIPSTICK OB
Bilirubin, UA: NEGATIVE
Blood, UA: NEGATIVE
Glucose, UA: NEGATIVE
Ketones, UA: NEGATIVE
Leukocytes, UA: NEGATIVE
Nitrite, UA: NEGATIVE
POC,PROTEIN,UA: NEGATIVE
Spec Grav, UA: 1.02 (ref 1.010–1.025)
Urobilinogen, UA: 0.2 U/dL
pH, UA: 5 (ref 5.0–8.0)

## 2023-06-06 MED ORDER — ZOLPIDEM TARTRATE 5 MG PO TABS: 5.0000 mg | ORAL_TABLET | Freq: Every evening | ORAL | 0 refills | Status: DC | PRN

## 2023-06-06 NOTE — Progress Notes (Signed)
Marland Kitchen

## 2023-06-06 NOTE — Progress Notes (Signed)
ROB doing well, feeling movement. Reviewed signs and symptoms of pretermlabor. Discussed GBS next week. Pt not feeling well due to restless leg syndrome and she is having right upper gastric pain. She has not been able to sleep.  Her BP today normal, urine dip negative for protein. Likely musculoskeletal pain . Will do CMP today to evaluate liver function. Discussed use of medication to assist with sleeping. She has taken tylenol pm without success. Discussed use of Ambien reviewed risks and benefits of use. Order placed.    Body mass index is 35.36 kg/m.

## 2023-06-07 ENCOUNTER — Encounter: Payer: Self-pay | Admitting: Certified Nurse Midwife

## 2023-06-07 LAB — COMPREHENSIVE METABOLIC PANEL
ALT: 8 [IU]/L (ref 0–32)
AST: 12 [IU]/L (ref 0–40)
Albumin: 3.5 g/dL — ABNORMAL LOW (ref 4.0–5.0)
Alkaline Phosphatase: 191 [IU]/L — ABNORMAL HIGH (ref 42–106)
BUN/Creatinine Ratio: 10 (ref 9–23)
BUN: 6 mg/dL (ref 6–20)
Bilirubin Total: 0.2 mg/dL (ref 0.0–1.2)
CO2: 21 mmol/L (ref 20–29)
Calcium: 9.2 mg/dL (ref 8.7–10.2)
Chloride: 105 mmol/L (ref 96–106)
Creatinine, Ser: 0.61 mg/dL (ref 0.57–1.00)
Globulin, Total: 2.5 g/dL (ref 1.5–4.5)
Glucose: 75 mg/dL (ref 70–99)
Potassium: 4.4 mmol/L (ref 3.5–5.2)
Sodium: 140 mmol/L (ref 134–144)
Total Protein: 6 g/dL (ref 6.0–8.5)
eGFR: 131 mL/min/{1.73_m2} (ref >59)

## 2023-06-13 ENCOUNTER — Ambulatory Visit (INDEPENDENT_AMBULATORY_CARE_PROVIDER_SITE_OTHER): Payer: MEDICAID | Admitting: Certified Nurse Midwife

## 2023-06-13 ENCOUNTER — Encounter: Payer: Self-pay | Admitting: Certified Nurse Midwife

## 2023-06-13 ENCOUNTER — Other Ambulatory Visit (HOSPITAL_COMMUNITY)
Admission: RE | Admit: 2023-06-13 | Discharge: 2023-06-13 | Disposition: A | Discharge status: Home / Self Care | Payer: MEDICAID | Source: Ambulatory Visit | Attending: Certified Nurse Midwife | Admitting: Certified Nurse Midwife

## 2023-06-13 VITALS — BP 122/71 | HR 90 | Wt 200.5 lb

## 2023-06-13 DIAGNOSIS — Z3A36 36 weeks gestation of pregnancy: Secondary | ICD-10-CM

## 2023-06-13 DIAGNOSIS — Z113 Encounter for screening for infections with a predominantly sexual mode of transmission: Secondary | ICD-10-CM

## 2023-06-13 DIAGNOSIS — Z3685 Encounter for antenatal screening for Streptococcus B: Secondary | ICD-10-CM

## 2023-06-13 DIAGNOSIS — O368130 Decreased fetal movements, third trimester, not applicable or unspecified: Secondary | ICD-10-CM | POA: Diagnosis not present

## 2023-06-13 DIAGNOSIS — Z3403 Encounter for supervision of normal first pregnancy, third trimester: Secondary | ICD-10-CM

## 2023-06-13 NOTE — Patient Instructions (Signed)
 Third Trimester of Pregnancy  The third trimester of pregnancy is from week 28 through week 40. This is months 7 through 9. The third trimester is a time when your baby is growing fast. Body changes during your third trimester Your body continues to change during this time. The changes usually go away after your baby is born. Physical changes You will continue to gain weight. You may get stretch marks on your hips, belly, and breasts. Your breasts will keep growing and may hurt. A yellow fluid (colostrum) may leak from your breasts. This is the first milk you're making for your baby. Your hair may grow faster and get thicker. In some cases, you may get hair loss. Your belly button may stick out. You may have more swelling in your hands, face, or ankles. Health changes You may have heartburn. You may feel short of breath. This is caused by the uterus that is now bigger. You may have more aches in the pelvis, back, or thighs. You may have more tingling or numbness in your hands, arms, and legs. You may pee more often. You may have trouble pooping (constipation) or swollen veins in the butt that can itch or get painful (hemorrhoids). Other changes You may have more problems sleeping. You may notice the baby moving lower in your belly (dropping). You may have more fluid coming from your vagina. Your joints may feel loose, and you may have pain around your pelvic bone. Follow these instructions at home: Medicines Take medicines only as told by your health care provider. Some medicines are not safe during pregnancy. Your provider may change the medicines that you take. Do not take any medicines unless told to by your provider. Take a prenatal vitamin that has at least 600 micrograms (mcg) of folic acid. Do not use herbal medicines, illegal drugs, or medicines that are not approved by your provider. Eating and drinking While you're pregnant your body needs additional nutrition to help  support your growing baby. Talk with your provider about your nutritional needs. Activity Most women are able to exercise regularly during pregnancy. Exercise routines may need to change at the end of your pregnancy. Talk to your provider about your activities and exercise routine. Relieving pain and discomfort Rest often with your legs raised if you have leg cramps or low back pain. Take warm sitz baths to soothe pain from hemorrhoids. Use hemorrhoid cream if your provider says it's okay. Wear a good, supportive bra if your breasts hurt. Do not use hot tubs, steam rooms, or saunas. Do not douche. Do not use tampons or scented pads. Safety Talk to your provider before traveling far distances. Wear your seatbelt at all times when you're in a car. Talk to your provider if someone hits you, hurts you, or yells at you. Preparing for birth To prepare for your baby: Take childbirth and breastfeeding classes. Visit the hospital and tour the maternity area. Buy a rear-facing car seat. Learn how to install it in your car. General instructions Avoid cat litter boxes and soil used by cats. These things carry germs that can cause harm to your pregnancy and your baby. Do not drink alcohol, smoke, vape, or use products with nicotine or tobacco in them. If you need help quitting, talk with your provider. Keep all follow-up visits for your third trimester. Your provider will do more exams and tests during this trimester. Write down your questions. Take them to your prenatal visits. Your provider also will: Talk with you about  your overall health. Give you advice or refer you to specialists who can help with different needs, including: Mental health and counseling. Foods and healthy eating. Ask for help if you need help with food. Where to find more information American Pregnancy Association: americanpregnancy.org [employer]necologists: acog.org Office on [employer] Health:  [employer] Contact a health care provider if: You have a headache that does not go away when you take medicine. You have any of these problems: You can't eat or drink. You have nausea and vomiting. You have watery poop (diarrhea) for 2 days or more. You have pain when you pee, or your pee smells bad. You have been sick for 2 days or more and aren't getting better. Contact your provider right away if: You have any of these coming from your vagina: Abnormal discharge. Bad-smelling fluid. Bleeding. Your baby is moving less than usual. You have signs of labor: You have any contractions, belly cramping, or have pain in your pelvis or lower back before 37 weeks of pregnancy (preterm labor). You have regular contractions that are less than 5 minutes apart. Your water breaks. You have symptoms of high blood pressure or preeclampsia. These include: A severe, throbbing headache that does not go away. Sudden or extreme swelling of your face, hands, legs, or feet. Vision problems: You see spots. You have blurry vision. Your eyes are sensitive to light. If you can't reach your provider, go to an urgent care or emergency room. Get help right away if: You faint, become confused, or can't think clearly. You have chest pain or trouble breathing. You have any kind of injury, such as from a fall or a car crash. These symptoms may be an emergency. Call 911 right away. Do not wait to see if the symptoms will go away. Do not drive yourself to the hospital. This information is not intended to replace advice given to you by your health care provider. Make sure you discuss any questions you have with your health care provider. Document Revised: 01/26/2023 Document Reviewed: 08/26/2022 Elsevier Patient Education  2024 Elsevier Inc. Group B Streptococcus Infection During Pregnancy Group B Streptococcus (GBS) is a type of bacteria that is often found in healthy people. It is commonly found in the  rectum, vagina, and intestines. In people who are healthy and not pregnant, the bacteria rarely cause serious illness or complications. However, women who test positive for GBS during pregnancy can pass the bacteria to the baby during childbirth. This can cause serious infection in the baby after birth. Women with GBS may also have infections during their pregnancy or soon after childbirth. The infections include urinary tract infections (UTIs) or infections of the uterus. GBS also increases a woman's risk of complications during pregnancy, such as early labor or delivery, miscarriage, or stillbirth. Routine testing for GBS is recommended for all pregnant women. What are the causes? This condition is caused by bacteria called Streptococcus agalactiae. What increases the risk? You may have a higher risk for GBS infection during pregnancy if you had one during a past pregnancy. What are the signs or symptoms? In most cases, GBS infection does not cause symptoms in pregnant women. If symptoms exist, they may include: Labor that starts before the 37th week of pregnancy. A UTI or bladder infection. This may cause a fever, frequent urination, or pain and burning during urination. Fever during labor. There can also be a rapid heartbeat in the mother or baby. Rare but serious symptoms of a GBS  infection in women include: Blood infection (septicemia). This may cause fever, chills, or confusion. Lung infection (pneumonia). This may cause fever, chills, cough, rapid breathing, chest pain, or difficulty breathing. Bone, joint, skin, or soft tissue infection. How is this diagnosed? You may be screened for GBS between week 35 and week 37 of pregnancy. If you have symptoms of preterm labor, you may be screened earlier. This condition is diagnosed based on lab test results from: A swab of fluid from the vagina and rectum. A urine sample. How is this treated? This condition is treated with antibiotic medicine.  Antibiotic medicine may be given: To you when you go into labor, or as soon as your water breaks. The medicines will continue until after you give birth. If you are having a cesarean delivery, you do not need antibiotics unless your water has broken. To your baby, if he or she requires treatment. Your health care provider will check your baby to decide if he or she needs antibiotics to prevent a serious infection. Follow these instructions at home: Take over-the-counter and prescription medicines only as told by your health care provider. Take your antibiotic medicine as told by your health care provider. Do not stop taking the antibiotic even if you start to feel better. Keep all pre-birth (prenatal) visits and follow-up visits as told by your health care provider. This is important. Contact a health care provider if: You have pain or burning when you urinate. You have to urinate more often than usual. You have a fever or chills. You develop a bad-smelling vaginal discharge. Get help right away if: Your water breaks. You go into labor. You have severe pain in your abdomen. You have difficulty breathing. You have chest pain. These symptoms may represent a serious problem that is an emergency. Do not wait to see if the symptoms will go away. Get medical help right away. Call your local emergency services (911 in the [place] Do not drive yourself to the hospital. Summary GBS is a type of bacteria that is common in healthy people. During pregnancy, colonization with GBS can cause serious complications for you or your baby. Your health care provider will screen you between 35 and 37 weeks of pregnancy to determine if you are colonized with GBS. If you are colonized with GBS during pregnancy, your health care provider will recommend antibiotics through an IV during labor. After delivery, your baby will be evaluated for complications related to potential GBS infection and may require antibiotics to  prevent a serious infection. This information is not intended to replace advice given to you by your health care provider. Make sure you discuss any questions you have with your health care provider. Document Revised: 04/11/2022 Document Reviewed: 04/11/2022 Elsevier Patient Education  2024 [employer].

## 2023-06-13 NOTE — Progress Notes (Signed)
    Return Prenatal Note   Subjective   23 y.o. G1P0 at [redacted]w[redacted]d presents for this follow-up prenatal visit.  Patient reports less movements over last few days to week, has been feeling baby move but sometimes will need to poke him to make him move. Denies contractions. Reports right rib pain with baby pushing up, feels this off and on and is not consistent with times of day. Patient reports: Movement: Present Contractions: Not present  Objective   Flow sheet Vitals: Pulse Rate: 90 BP: 122/71 Fundal Height: 36 cm Fetal Heart Rate (bpm): 125 Presentation: Vertex ([place]) Total weight gain: 40 lb 8 oz (18.4 kg)  General Appearance  No acute distress, well appearing, and well nourished Pulmonary   Normal work of breathing Neurologic   Alert and oriented to person, place, and time Psychiatric   Mood and affect within normal limits   Fetus A Non-Stress Test Interpretation for 06/13/23  Indication: Decreased Fetal Movement  Fetal Heart Rate A Mode: External Baseline Rate (A): 125 bpm Variability: Moderate Accelerations: 15 x 15 Decelerations: None Multiple birth?: No  Uterine Activity Mode: Toco Contraction Frequency (min): no contractions noted Resting Tone Palpated: Relaxed Resting Time: Adequate  Interpretation (Fetal Testing) Nonstress Test Interpretation: Reactive Overall Impression: Reassuring for gestational age    Assessment/Plan   Plan  23 y.o. G1P0 at [redacted]w[redacted]d presents for follow-up OB visit. Reviewed prenatal record including previous visit note.  Supervision of normal pregnancy Reviewed kick counts and preterm labor warning signs. Instructed to call office or come to hospital with persistent headache, vision changes, regular contractions, leaking of fluid, decreased fetal movement or vaginal bleeding. GBS & GC/Ct self collected. NST reactive today, reviewed fetal movement monitoring.      Orders Placed This Encounter  Procedures   Culture, beta  strep (group b only)   Return in 1 week (on 06/20/2023) for ROB.   Future Appointments  Date Time Provider Department Center  06/20/2023  3:15 PM Justino Eleanor HERO, CNM [facility] None  06/27/2023  3:35 PM Connell Davies, MD [facility] None    For next visit:  continue with routine prenatal care     Harlene LITTIE Cisco, CNM  02/04/254:01 PM

## 2023-06-13 NOTE — Assessment & Plan Note (Signed)
 Reviewed kick counts and preterm labor warning signs. Instructed to call office or come to hospital with persistent headache, vision changes, regular contractions, leaking of fluid, decreased fetal movement or vaginal bleeding. GBS & GC/Ct self collected. NST reactive today, reviewed fetal movement monitoring.

## 2023-06-14 ENCOUNTER — Inpatient Hospital Stay (HOSPITAL_COMMUNITY)
Admission: AD | Admit: 2023-06-14 | Discharge: 2023-06-14 | Disposition: A | Discharge status: Home / Self Care | Payer: MEDICAID | Attending: Obstetrics and Gynecology | Admitting: Obstetrics and Gynecology

## 2023-06-14 ENCOUNTER — Encounter (HOSPITAL_COMMUNITY): Payer: Self-pay | Admitting: Obstetrics and Gynecology

## 2023-06-14 DIAGNOSIS — O99513 Diseases of the respiratory system complicating pregnancy, third trimester: Secondary | ICD-10-CM | POA: Insufficient documentation

## 2023-06-14 DIAGNOSIS — O212 Late vomiting of pregnancy: Secondary | ICD-10-CM | POA: Insufficient documentation

## 2023-06-14 DIAGNOSIS — Z3A36 36 weeks gestation of pregnancy: Secondary | ICD-10-CM | POA: Insufficient documentation

## 2023-06-14 DIAGNOSIS — R051 Acute cough: Secondary | ICD-10-CM

## 2023-06-14 DIAGNOSIS — J029 Acute pharyngitis, unspecified: Secondary | ICD-10-CM | POA: Diagnosis not present

## 2023-06-14 DIAGNOSIS — J101 Influenza due to other identified influenza virus with other respiratory manifestations: Secondary | ICD-10-CM | POA: Diagnosis not present

## 2023-06-14 DIAGNOSIS — O26893 Other specified pregnancy related conditions, third trimester: Secondary | ICD-10-CM | POA: Diagnosis present

## 2023-06-14 DIAGNOSIS — R112 Nausea with vomiting, unspecified: Secondary | ICD-10-CM

## 2023-06-14 LAB — COMPREHENSIVE METABOLIC PANEL
ALT: 12 U/L (ref 0–44)
AST: 19 U/L (ref 15–41)
Albumin: 2.5 g/dL — ABNORMAL LOW (ref 3.5–5.0)
Alkaline Phosphatase: 175 U/L — ABNORMAL HIGH (ref 38–126)
Anion gap: 12 (ref 5–15)
BUN: <5 mg/dL — ABNORMAL LOW (ref 6–20)
CO2: 21 mmol/L — ABNORMAL LOW (ref 22–32)
Calcium: 9 mg/dL (ref 8.9–10.3)
Chloride: 102 mmol/L (ref 98–111)
Creatinine, Ser: 0.57 mg/dL (ref 0.44–1.00)
GFR, Estimated: >60 mL/min (ref >60)
Glucose, Bld: 81 mg/dL (ref 70–99)
Potassium: 3.9 mmol/L (ref 3.5–5.1)
Sodium: 135 mmol/L (ref 135–145)
Total Bilirubin: 0.5 mg/dL (ref 0.0–1.2)
Total Protein: 6.2 g/dL — ABNORMAL LOW (ref 6.5–8.1)

## 2023-06-14 LAB — CBC
HCT: 26.5 % — ABNORMAL LOW (ref 36.0–46.0)
Hemoglobin: 8.5 g/dL — ABNORMAL LOW (ref 12.0–15.0)
MCH: 26.3 pg (ref 26.0–34.0)
MCHC: 32.1 g/dL (ref 30.0–36.0)
MCV: 82 fL (ref 80.0–100.0)
Platelets: 371 10*3/uL (ref 150–400)
RBC: 3.23 MIL/uL — ABNORMAL LOW (ref 3.87–5.11)
RDW: 14.2 % (ref 11.5–15.5)
WBC: 16.3 10*3/uL — ABNORMAL HIGH (ref 4.0–10.5)
nRBC: 0 % (ref 0.0–0.2)

## 2023-06-14 LAB — RESP PANEL BY RT-PCR (RSV, FLU A&B, COVID)  RVPGX2
Influenza A by PCR: POSITIVE — AB
Influenza B by PCR: NEGATIVE
Resp Syncytial Virus by PCR: NEGATIVE
SARS Coronavirus 2 by RT PCR: NEGATIVE

## 2023-06-14 LAB — URINALYSIS, ROUTINE W REFLEX MICROSCOPIC
Bilirubin Urine: NEGATIVE
Glucose, UA: NEGATIVE mg/dL
Hgb urine dipstick: NEGATIVE
Ketones, ur: 5 mg/dL — AB
Nitrite: NEGATIVE
Protein, ur: 30 mg/dL — AB
Specific Gravity, Urine: 1.017 (ref 1.005–1.030)
pH: 6 (ref 5.0–8.0)

## 2023-06-14 MED ORDER — OSELTAMIVIR PHOSPHATE 75 MG PO CAPS: 75.0000 mg | ORAL_CAPSULE | Freq: Two times a day (BID) | ORAL | 0 refills | Status: DC

## 2023-06-14 MED ORDER — FLUTICASONE PROPIONATE 50 MCG/ACT NA SUSP
2.0000 | Freq: Once | NASAL | Status: AC
  Administered 2023-06-14: 2 via NASAL
  Filled 2023-06-14: qty 16

## 2023-06-14 MED ORDER — LACTATED RINGERS IV BOLUS: 1000.0000 mL | Freq: Once | INTRAVENOUS | Status: DC

## 2023-06-14 MED ORDER — ONDANSETRON 4 MG PO TBDP
8.0000 mg | ORAL_TABLET | Freq: Once | ORAL | Status: AC
  Administered 2023-06-14: 8 mg via ORAL
  Filled 2023-06-14: qty 2

## 2023-06-14 MED ORDER — DEXTROMETHORPHAN POLISTIREX ER 30 MG/5ML PO SUER
30.0000 mg | Freq: Once | ORAL | Status: AC
Start: 2023-06-14 — End: 2023-06-14
  Administered 2023-06-14: 30 mg via ORAL
  Filled 2023-06-14: qty 5

## 2023-06-14 MED ORDER — ACETAMINOPHEN 500 MG PO TABS
1000.0000 mg | ORAL_TABLET | Freq: Once | ORAL | Status: AC
  Administered 2023-06-14: 1000 mg via ORAL
  Filled 2023-06-14: qty 2

## 2023-06-14 NOTE — MAU Provider Note (Addendum)
 Chief Complaint:  Nausea, Emesis, and Sore Throat   HPI   None     Robin Pineda is a 23 y.o. G1P0 at [redacted]w[redacted]d who presents to maternity admissions reporting vomitting  x 10 today acute onset and respiratory concerns which is mucus and nasal congestion with cough. Denies fever, reports + Chills, denies  sick contacts. Able to tolerate PO fluids , no food.  Denies any VB, LOF, CTX. Seen 06/13/23  Pregnancy Course: [place]  Past Medical History:  Diagnosis Date   Anxiety    Arm fracture, left    Arm fracture, right    Asthma    Broken nose    Depression    Enlarged tonsils    Headache    POTS (postural orthostatic tachycardia syndrome)    Seasonal allergies    UTI (urinary tract infection)    OB History  Gravida Para Term Preterm AB Living  1       SAB IAB Ectopic Multiple Live Births          # Outcome Date GA Lbr Len/2nd Weight Sex Type Anes PTL Lv  1 Current            Past Surgical History:  Procedure Laterality Date   OTHER SURGICAL HISTORY Right 2011   Pins placed to repair break in right arm   TONSILECTOMY, ADENOIDECTOMY, BILATERAL MYRINGOTOMY AND TUBES     TONSILLECTOMY     Family History  Problem Relation Age of Onset   Lupus Mother    Heart failure Father    Diabetes Father    Hypertension Father    Healthy Brother    Other Brother        cardiomyopathy; heart block   Healthy Maternal Grandmother    Healthy Maternal Grandfather    Other Paternal Grandmother        cardiomyopathy   Healthy Paternal Grandfather    Social History   Tobacco Use   Smoking status: Former    Current packs/day: 0.00    Types: Cigarettes    Quit date: 10/2022    Years since quitting: 0.6   Smokeless tobacco: Never   Tobacco comments:    Trying to stop using patches  Vaping Use   Vaping status: Some Days   Substances: Nicotine , THC, Flavoring  Substance Use Topics   Alcohol use: No   Drug use: Yes    Frequency: 7.0 times per week    Types:  Marijuana    Comment: daily marijuana, 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 12 tablet 0 06/14/2023 Morning   albuterol  (VENTOLIN  HFA) 108 (90 Base) MCG/ACT inhaler Inhale 1-2 puffs into the lungs every 6 (six) hours as needed. (Patient not taking: Reported on 06/13/2023) 8 g 0    famotidine  (PEPCID ) 20 MG tablet Take 1 tablet (20 mg total) by mouth 2 (two) times daily. (Patient not taking: Reported on 06/13/2023) 60 tablet 3    Prenat-FeCbn-FeAsp-Meth-FA-DHA (PRENATE MINI ) 18-0.6-0.4-350 MG CAPS Take 1 tablet by mouth daily. 90 capsule 4    zolpidem  (AMBIEN ) 5 MG tablet Take 1 tablet (5 mg total) by mouth at bedtime as needed for sleep. 10 tablet 0     I have reviewed patient's Past Medical Hx,  Surgical Hx, Family Hx, Social Hx, medications and allergies.   ROS  Pertinent items noted in HPI and remainder of comprehensive ROS otherwise negative.   PHYSICAL EXAM  Patient Vitals for the past 24 hrs:  BP Temp Temp src Pulse Resp SpO2 Height Weight  06/14/23 1831 135/74 98.1 F (36.7 C) Oral (!) 105 18 98 % 5' 2 (1.575 m) 89.4 kg    Constitutional: Well-developed, well-nourished female in no acute distress.  Cardiovascular: normal rate & rhythm, warm and well-perfused Respiratory: normal effort, no problems with respiration noted, Lungs BCTA GI: Abd soft, non-tender, non-distended MS: Extremities nontender, no edema, normal ROM Neurologic: Alert and oriented x 4.  GU: no CVA tenderness Pelvic:deferred     Fetal Tracing: At 1920 reviewed Cat 1 reactive Baseline: 130 Variability: moderate  Accelerations: present Decelerations: absent Toco: irritable   Labs: Results for orders placed or performed during the hospital encounter of 06/14/23 (from the past 24  hours)  Urinalysis, Routine w reflex microscopic -Urine, Clean Catch     Status: Abnormal   Collection Time: 06/14/23  6:38 PM  Result Value Ref Range   Color, Urine AMBER (A) YELLOW   APPearance HAZY (A) CLEAR   Specific Gravity, Urine 1.017 1.005 - 1.030   pH 6.0 5.0 - 8.0   Glucose, UA NEGATIVE NEGATIVE mg/dL   Hgb urine dipstick NEGATIVE NEGATIVE   Bilirubin Urine NEGATIVE NEGATIVE   Ketones, ur 5 (A) NEGATIVE mg/dL   Protein, ur 30 (A) NEGATIVE mg/dL   Nitrite NEGATIVE NEGATIVE   Leukocytes,Ua TRACE (A) NEGATIVE   RBC / HPF 0-5 0 - 5 RBC/hpf   WBC, UA 0-5 0 - 5 WBC/hpf   Bacteria, UA RARE (A) NONE SEEN   Squamous Epithelial / HPF 11-20 0 - 5 /HPF   Mucus PRESENT      MDM & MAU COURSE  MDM:     MAU Course: Orders Placed This Encounter  Procedures   Resp panel by RT-PCR (RSV, Flu A&B, Covid) Anterior Nasal Swab   Urinalysis, Routine w reflex microscopic -Urine, Clean Catch   CBC   Comprehensive metabolic panel   Airborne and Contact precautions   Meds ordered this encounter  Medications   ondansetron  (ZOFRAN -ODT) disintegrating tablet 8 mg   DISCONTD: lactated ringers  bolus 1,000 mL   dextromethorphan  (DELSYM ) 30 MG/5ML liquid 30 mg   Assumed care Discussed Flu with expected course, warning signs and treatment Will medicate with Tylenol  and Flonase  before discharge   ASSESSMENT  Single IUP at [redacted]w[redacted]d Cough;congestion Nausea and vomiting Influenza A   PLAN  Discharge home Rx Tamiflu  for flu Flonase  prn congestion Mucinex for congestion Followup in office  Encouraged to return if she develops worsening of symptoms, increase in pain, fever, or other concerning symptoms.   Trudy Earnie CROME, CNM     Olam Dalton, MSN, [facility], Center for [facility]

## 2023-06-14 NOTE — MAU Note (Signed)
..  Robin Pineda is a 23 y.o. at [redacted]w[redacted]d here in MAU reporting: N/V, sore throat, and cough that began today. Has vomited over 10 times today.  Took Deslym for the cough, did not help much.  Denies vaginal bleeding, leaking of fluid, or contractions. +FM No one around her has been sick.   Pain score:7/10 Vitals:   06/14/23 1831  BP: 135/74  Pulse: (!) 105  Resp: 18  Temp: 98.1 F (36.7 C)  SpO2: 98%     QYU:jeeopzi in room 150 Lab orders placed from triage:  None

## 2023-06-15 LAB — CERVICOVAGINAL ANCILLARY ONLY
Chlamydia: NEGATIVE
Comment: NEGATIVE
Comment: NORMAL
Neisseria Gonorrhea: NEGATIVE

## 2023-06-17 ENCOUNTER — Encounter: Payer: Self-pay | Admitting: Certified Nurse Midwife

## 2023-06-17 LAB — CULTURE, BETA STREP (GROUP B ONLY): Strep Gp B Culture: NEGATIVE

## 2023-06-20 ENCOUNTER — Ambulatory Visit (INDEPENDENT_AMBULATORY_CARE_PROVIDER_SITE_OTHER): Payer: MEDICAID | Admitting: Obstetrics

## 2023-06-20 ENCOUNTER — Other Ambulatory Visit (HOSPITAL_COMMUNITY)
Admission: RE | Admit: 2023-06-20 | Discharge: 2023-06-20 | Disposition: A | Discharge status: Home / Self Care | Payer: MEDICAID | Source: Ambulatory Visit | Attending: Obstetrics | Admitting: Obstetrics

## 2023-06-20 VITALS — BP 121/83 | HR 59 | Wt 193.0 lb

## 2023-06-20 DIAGNOSIS — E669 Obesity, unspecified: Secondary | ICD-10-CM

## 2023-06-20 DIAGNOSIS — Z3A37 37 weeks gestation of pregnancy: Secondary | ICD-10-CM

## 2023-06-20 DIAGNOSIS — O26891 Other specified pregnancy related conditions, first trimester: Secondary | ICD-10-CM | POA: Diagnosis present

## 2023-06-20 DIAGNOSIS — Z3493 Encounter for supervision of normal pregnancy, unspecified, third trimester: Secondary | ICD-10-CM

## 2023-06-20 DIAGNOSIS — N898 Other specified noninflammatory disorders of vagina: Secondary | ICD-10-CM | POA: Insufficient documentation

## 2023-06-20 DIAGNOSIS — Z3490 Encounter for supervision of normal pregnancy, unspecified, unspecified trimester: Secondary | ICD-10-CM

## 2023-06-20 NOTE — Assessment & Plan Note (Signed)
-  Reviewed s/s of labor and when to go to the hospital -Reviewed comfort measures for tail bone pain -Swab collected for increased discharge -SVE thick, closed, ballotable

## 2023-06-20 NOTE — Progress Notes (Signed)
    Return Prenatal Note   Assessment/Plan   Plan  23 y.o. G1P0 at [redacted]w[redacted]d presents for follow-up OB visit. Reviewed prenatal record including previous visit note.  Supervision of normal pregnancy -Reviewed s/s of labor and when to go to the hospital -Reviewed comfort measures for tail bone pain -Swab collected for increased discharge -SVE thick, closed, ballotable    No orders of the defined types were placed in this encounter.  Return in about 1 week (around 06/27/2023).   Future Appointments  Date Time Provider Department Center  06/27/2023  3:35 PM Hildred Laser, MD [facility] None    For next visit:  Routine prenatal care    Subjective   Robin Pineda is feeling much  better after and is no longer febrile or vomiting. She has been having increased discharge. She has been having tailbone pain - she has tried alternating heat/ice and going to the pool with some relief. No s/s of labor yet.  Movement: Present Contractions: Not present  Objective   Flow sheet Vitals: Pulse Rate: (!) 59 BP: 121/83 Fundal Height: 35 cm Fetal Heart Rate (bpm): 140 Dilation: Closed Effacement (%): Thick Station: Ballotable Total weight gain: 33 lb (15 kg)  General Appearance  No acute distress, well appearing, and well nourished Pulmonary   Normal work of breathing Neurologic   Alert and oriented to person, place, and time Psychiatric   Mood and affect within normal limits  Guadlupe Spanish, CNM 06/20/23 3:37 PM

## 2023-06-22 LAB — CERVICOVAGINAL ANCILLARY ONLY
Bacterial Vaginitis (gardnerella): NEGATIVE
Candida Glabrata: NEGATIVE
Candida Vaginitis: POSITIVE — AB
Chlamydia: NEGATIVE
Comment: NEGATIVE
Comment: NEGATIVE
Comment: NEGATIVE
Comment: NEGATIVE
Comment: NEGATIVE
Comment: NORMAL
Neisseria Gonorrhea: NEGATIVE
Trichomonas: NEGATIVE

## 2023-06-23 ENCOUNTER — Encounter: Payer: Self-pay | Admitting: Certified Nurse Midwife

## 2023-06-23 ENCOUNTER — Observation Stay
Admission: EM | Admit: 2023-06-23 | Discharge: 2023-06-23 | Disposition: A | Discharge status: Home / Self Care | Payer: MEDICAID | Attending: Obstetrics and Gynecology | Admitting: Obstetrics and Gynecology

## 2023-06-23 ENCOUNTER — Other Ambulatory Visit: Payer: Self-pay | Admitting: Certified Nurse Midwife

## 2023-06-23 DIAGNOSIS — A084 Viral intestinal infection, unspecified: Secondary | ICD-10-CM | POA: Insufficient documentation

## 2023-06-23 DIAGNOSIS — O26899 Other specified pregnancy related conditions, unspecified trimester: Principal | ICD-10-CM | POA: Diagnosis present

## 2023-06-23 DIAGNOSIS — R7401 Elevation of levels of liver transaminase levels: Secondary | ICD-10-CM | POA: Diagnosis not present

## 2023-06-23 DIAGNOSIS — O26893 Other specified pregnancy related conditions, third trimester: Secondary | ICD-10-CM | POA: Diagnosis present

## 2023-06-23 DIAGNOSIS — O99613 Diseases of the digestive system complicating pregnancy, third trimester: Secondary | ICD-10-CM | POA: Diagnosis not present

## 2023-06-23 DIAGNOSIS — Z3A37 37 weeks gestation of pregnancy: Secondary | ICD-10-CM | POA: Insufficient documentation

## 2023-06-23 DIAGNOSIS — O219 Vomiting of pregnancy, unspecified: Secondary | ICD-10-CM

## 2023-06-23 DIAGNOSIS — O26613 Liver and biliary tract disorders in pregnancy, third trimester: Secondary | ICD-10-CM | POA: Diagnosis not present

## 2023-06-23 LAB — CBC
HCT: 29.2 % — ABNORMAL LOW (ref 36.0–46.0)
Hemoglobin: 9.5 g/dL — ABNORMAL LOW (ref 12.0–15.0)
MCH: 26.1 pg (ref 26.0–34.0)
MCHC: 32.5 g/dL (ref 30.0–36.0)
MCV: 80.2 fL (ref 80.0–100.0)
Platelets: 288 10*3/uL (ref 150–400)
RBC: 3.64 MIL/uL — ABNORMAL LOW (ref 3.87–5.11)
RDW: 15.1 % (ref 11.5–15.5)
WBC: 19.3 10*3/uL — ABNORMAL HIGH (ref 4.0–10.5)
nRBC: 0.2 % (ref 0.0–0.2)

## 2023-06-23 LAB — URINALYSIS, ROUTINE W REFLEX MICROSCOPIC
Bilirubin Urine: NEGATIVE
Glucose, UA: NEGATIVE mg/dL
Hgb urine dipstick: NEGATIVE
Ketones, ur: NEGATIVE mg/dL
Nitrite: NEGATIVE
Protein, ur: 30 mg/dL — AB
Specific Gravity, Urine: 1.02 (ref 1.005–1.030)
pH: 7 (ref 5.0–8.0)

## 2023-06-23 LAB — COMPREHENSIVE METABOLIC PANEL
ALT: 49 U/L — ABNORMAL HIGH (ref 0–44)
AST: 55 U/L — ABNORMAL HIGH (ref 15–41)
Albumin: 2.9 g/dL — ABNORMAL LOW (ref 3.5–5.0)
Alkaline Phosphatase: 176 U/L — ABNORMAL HIGH (ref 38–126)
Anion gap: 11 (ref 5–15)
BUN: 6 mg/dL (ref 6–20)
CO2: 20 mmol/L — ABNORMAL LOW (ref 22–32)
Calcium: 8.9 mg/dL (ref 8.9–10.3)
Chloride: 106 mmol/L (ref 98–111)
Creatinine, Ser: 0.64 mg/dL (ref 0.44–1.00)
GFR, Estimated: >60 mL/min (ref >60)
Glucose, Bld: 93 mg/dL (ref 70–99)
Potassium: 4.2 mmol/L (ref 3.5–5.1)
Sodium: 137 mmol/L (ref 135–145)
Total Bilirubin: 0.4 mg/dL (ref 0.0–1.2)
Total Protein: 6.8 g/dL (ref 6.5–8.1)

## 2023-06-23 LAB — PROTEIN / CREATININE RATIO, URINE
Creatinine, Urine: 242 mg/dL
Protein Creatinine Ratio: 0.14 mg/mg{creat} (ref 0.00–0.15)
Total Protein, Urine: 35 mg/dL

## 2023-06-23 MED ORDER — ACETAMINOPHEN 325 MG PO TABS: 650.0000 mg | ORAL_TABLET | ORAL | Status: DC | PRN

## 2023-06-23 MED ORDER — ONDANSETRON 4 MG PO TBDP
4.0000 mg | ORAL_TABLET | Freq: Once | ORAL | Status: AC
  Administered 2023-06-23: 4 mg via ORAL
  Filled 2023-06-23: qty 1

## 2023-06-23 MED ORDER — ONDANSETRON 4 MG PO TBDP: 4.0000 mg | ORAL_TABLET | Freq: Three times a day (TID) | ORAL | 1 refills | Status: DC | PRN

## 2023-06-23 MED ORDER — FAMOTIDINE 20 MG PO TABS: 20.0000 mg | ORAL_TABLET | Freq: Two times a day (BID) | ORAL | 3 refills | Status: DC

## 2023-06-23 MED ORDER — FAMOTIDINE 20 MG PO TABS
20.0000 mg | ORAL_TABLET | Freq: Once | ORAL | Status: AC
  Administered 2023-06-23: 20 mg via ORAL
  Filled 2023-06-23: qty 1

## 2023-06-23 MED ORDER — LACTATED RINGERS IV SOLN: 500.0000 mL | INTRAVENOUS | Status: DC | PRN

## 2023-06-23 MED ORDER — SOD CITRATE-CITRIC ACID 500-334 MG/5ML PO SOLN: 30.0000 mL | ORAL | Status: DC | PRN

## 2023-06-23 NOTE — Discharge Summary (Signed)
LABOR & DELIVERY OB TRIAGE NOTE  SUBJECTIVE  HPI Robin Pineda is a 23 y.o. G1P0 at [redacted]w[redacted]d who presents to Labor & Delivery for nausea, vomiting, abdominal pain that began at 3am today. She was woken from sleep with pain in the center of her upper abdomen and then vomited 5-6 times. Last episode of vomiting was at 930am and she has been able to keep water down. She endorses normal fetal movement, denies contractions, loss of fluid or vaginal bleeding, denies headache or visual changes.   OB History     Gravida  1   Para      Term      Preterm      AB      Living         SAB      IAB      Ectopic      Multiple      Live Births              Scheduled Meds: Continuous Infusions:  lactated ringers     PRN Meds:.acetaminophen, lactated ringers, sodium citrate-citric acid  OBJECTIVE  BP 120/69 (BP Location: Left Arm)   Pulse 73   Temp 98.2 F (36.8 C) (Oral)   Resp 17   LMP 10/04/2022 (Approximate)   General: A&Ox4, NAD, pale Heart: regular rate Lungs: normal work of breathing Abdomen: tender to palpation across abdomen just under ribs, gravid, no fundal tenderness. Cervical exam:   deferred  NST I reviewed the NST and it was reactive.  Baseline: 125 Variability: moderate Accelerations: present Decelerations: absent Toco: irritability, soft resting tone Category I   CBC    Component Value Date/Time   WBC 19.3 (H) 06/23/2023 1128   RBC 3.64 (L) 06/23/2023 1128   HGB 9.5 (L) 06/23/2023 1128   HGB 10.3 (L) 04/18/2023 1006   HCT 29.2 (L) 06/23/2023 1128   HCT 31.7 (L) 04/18/2023 1006   PLT 288 06/23/2023 1128   PLT 298 04/18/2023 1006   MCV 80.2 06/23/2023 1128   MCV 92 04/18/2023 1006   MCH 26.1 06/23/2023 1128   MCHC 32.5 06/23/2023 1128   RDW 15.1 06/23/2023 1128   RDW 12.0 04/18/2023 1006   LYMPHSABS 2.4 04/18/2023 1006   MONOABS 0.5 12/05/2020 1527   EOSABS 0.1 04/18/2023 1006   BASOSABS 0.0 04/18/2023 1006   CMP     Component  Value Date/Time   NA 137 06/23/2023 1128   NA 140 06/06/2023 1539   K 4.2 06/23/2023 1128   CL 106 06/23/2023 1128   CO2 20 (L) 06/23/2023 1128   GLUCOSE 93 06/23/2023 1128   BUN 6 06/23/2023 1128   BUN 6 06/06/2023 1539   CREATININE 0.64 06/23/2023 1128   CALCIUM 8.9 06/23/2023 1128   PROT 6.8 06/23/2023 1128   PROT 6.0 06/06/2023 1539   ALBUMIN 2.9 (L) 06/23/2023 1128   ALBUMIN 3.5 (L) 06/06/2023 1539   AST 55 (H) 06/23/2023 1128   ALT 49 (H) 06/23/2023 1128   ALKPHOS 176 (H) 06/23/2023 1128   BILITOT 0.4 06/23/2023 1128   BILITOT 0.2 06/06/2023 1539   EGFR 131 06/06/2023 1539   GFRNONAA >60 06/23/2023 1128     Latest Reference Range & Units 06/23/23 10:55  Total Protein, Urine mg/dL 35  Protein Creatinine Ratio 0.00 - 0.15 mg/mgCre 0.14  Creatinine, Urine mg/dL 295   ASSESSMENT Impression  1) Pregnancy at G1P0, [redacted]w[redacted]d, Estimated Date of Delivery: 07/11/23 2) Reassuring maternal/fetal status 3) Viral gastroenteritis vs  acid reflux 4) Mildly elevated LFTs with WBC 19   PLAN Able to po hydrate in triage, pain resolved after oral zofran, famotidine, ice pack. Normotensive & denies scotomata or headache with reassuring UP/C, lab changes possibly due to illness. Plan recheck at [facility] visit 2/18. Reinforced labor, fetal movement monitoring, pre-eclampsia warning signs, return to L&D if pain returns despite antacid & zofran. Dominica Severin, CNM 06/23/23  12:06 PM

## 2023-06-23 NOTE — Progress Notes (Addendum)
Pt is a 22yo G1P0, 37w 3d. She arrived to the unit with complaints of 10/10 upper abdominal pain that is constant. She states that at 0300 this morning she woke up with the pain accompanied with emesis. She states since 0300 she has thrown up 5-6 times with the last time being at 0930 before coming to the hospital. She states that she has been getting over the flu but has been feeling better the last few days until this.  She denies vaginal bleeding, reports positive fetal movement, and reports no contractions at this time. VS stable, monitors applied and assessing.   Initial FHT 145 at 1039.

## 2023-06-24 ENCOUNTER — Other Ambulatory Visit: Payer: Self-pay | Admitting: Obstetrics

## 2023-06-24 ENCOUNTER — Encounter: Payer: Self-pay | Admitting: Obstetrics

## 2023-06-24 MED ORDER — FLUCONAZOLE 150 MG PO TABS: 150.0000 mg | ORAL_TABLET | Freq: Once | ORAL | 0 refills | Status: AC

## 2023-06-27 ENCOUNTER — Encounter: Payer: Self-pay | Admitting: Obstetrics and Gynecology

## 2023-06-27 ENCOUNTER — Ambulatory Visit (INDEPENDENT_AMBULATORY_CARE_PROVIDER_SITE_OTHER): Payer: MEDICAID | Admitting: Obstetrics and Gynecology

## 2023-06-27 VITALS — BP 121/70 | HR 95 | Wt 195.7 lb

## 2023-06-27 DIAGNOSIS — Z3493 Encounter for supervision of normal pregnancy, unspecified, third trimester: Secondary | ICD-10-CM | POA: Diagnosis not present

## 2023-06-27 DIAGNOSIS — Z3A38 38 weeks gestation of pregnancy: Secondary | ICD-10-CM

## 2023-06-27 DIAGNOSIS — R748 Abnormal levels of other serum enzymes: Secondary | ICD-10-CM

## 2023-06-27 DIAGNOSIS — Z3490 Encounter for supervision of normal pregnancy, unspecified, unspecified trimester: Secondary | ICD-10-CM

## 2023-06-27 DIAGNOSIS — O26849 Uterine size-date discrepancy, unspecified trimester: Secondary | ICD-10-CM

## 2023-06-27 NOTE — Progress Notes (Addendum)
ROB: Patient is a 23 y.o. G1P0 at [redacted]w[redacted]d who presents for routine OB care.  Pregnancy is complicated by: Migraine without aura and without status migrainosus, not intractable; Episodic tension-type headache; Obesity; Disequilibrium; MDD (major depressive disorder), recurrent, severe, with psychosis (HCC); Supervision of normal pregnancy; Bipolar disorder (HCC); Rh negative state in antepartum period; Anemia affecting pregnancy; and Abdominal pain affecting pregnancy on their problem list.  Patient has complaints of general maliase.  Recently seen in triage last week due gastroenteritis. Still feels sluggish and overall miserable.  Notes abdominal pain is less and not vomiting consistently anymore. Has Zofran as needed. Is ready for her pregnancy to be over.  Is taking natural supplements for cervical ripening (pineapples, red raspberry leaf tea, evening primrose oil).  Reviewed signs of labor.  Had elevated LFTs during triage visit last week, will f/u to ensure downward trend. Growth still appears to be lagging after several visits. Had [place] 1 month ago with normal growth. Will repeat.  RTC in 1 week.

## 2023-06-27 NOTE — Patient Instructions (Signed)
 Natural methods to encourage timely labor   Your EDD (estimated due date) is just that... an estimate.  Your baby may come earlier or later, there are a lot of hormones and feedback loops to cause labor to occur and to be honest, no one actually knows the EXACT reason why this occurs.     Full term is birth after 37 weeks, while post-term is birth after 41 weeks.  We will guide you in this process of determining labor and delivery planning, but we hope that you will go into spontaneous labor between these 4 weeks.  Review the information in your Midwifery handout for our induction of labor detail, statistics, and processes as when your pregnancy ages, so does your placenta and this can lead to poorer placental.    The best natural method to consider when encouraging labor is time and patience.  This information below is to discuss ways that your body can prepare and be ready for labor and encourage this to happen on its own without a medical induction at the hospital.  Many methods do not have a ton of evidence based research behind them, but do come with minimal risk and potential good benefits  of helping your body prepare for labor. The information on herbs has been gathered over hundreds of years and has little scientific research, but has been tested by time and experience.       At 36 weeks: EVENING PRIMROSE OIL: -- These are available in capsules (500-1000mg ) at whole foods, [employer], [place], etc.  -- May take 1-3 capsules a day.  The capsules can be taken orally and swallowed, or inserted vaginally at night.  They will leak out overnight, so wear a pad or a liner to avoid oil on your sheets.  -- May take one capsule orally and insert one vaginally at night until delivery.  May increase to 2 vaginally at night from 38 weeks to delivery.  -- These contain GLA (an Omega-6 fatty acid) and are a precursor to labor hormones and have been shown to get the body ready for labor.  They may soften  the cervix, but will not cause you to be induced, so take without worry.   -- If you have had a vaginal delivery in the past, likely your cervix is already ripe and you do not need to take these. Discuss with your midwife.    DATES: -- Eat 6 dates a day from 36 weeks until delivery. If you have gestational diabetes, please discuss this with [place], as you should probably not use this with the high sugar content in dates.   -- One study showed a reduced need for inducing and augmenting labor and a more favorable delivery outcome, but this was not statistically significant in the study.    RED RASPBERRY LEAF TEA -- Drink 1-2 cups a day until labor begins to help tone the uterus and organize contractions -- Make a strong tea using 1/4 oz dried herb to 1 pint of water and steep for 20 minutes (or use some that is commercially available).  -- Try to do this early in the day as contractions overnight can disrupt a good nights sleep!   ACUPUNCTURE -- can be done weekly starting at 36 weeks by a trained acupuncturist.  There are many in the area that perform this using fine needles at certain points on the body to help ripen the cervix and induce labor.  -- if this is performed close to the  due date, it has been shown to have a success rate of up to 88% in starting labor.    At 37 weeks:  EVENING PRIMROSE OIL --Take 1000 mg  twice daily - by mouth in the morning and vaginally at bedtime. (If previous C-section, take both doses by mouth)   At 38-39 weeks:  BLUE COHOSH 1 capsule daily starting at [redacted] weeks gestation 2 capsules daily starting at [redacted] weeks gestation 3 capsules daily starting at [redacted] weeks gestation   OTHER SUGGESTIONS: SPINNING BABIES and the MILES CIRCUIT (to encourage optimal positioning)  www.spinningbabies.com & https://glass.com/.com    BELLY BINDING --Poor abdominal support can lead to malpositioned babies and therefore prodromal labor.  Support good abdominal tone and relieve back  pain by wearing a tummy support whether with a belly binder, using a sheet to wrap around your hips, or a rebozo.  It will lift up your baby and place them more centrally in your pelvis to encourage pressure on your cervix.     EXERCISE -- Walking, belly dancing, swimming, bouncing on the birthing ball and some yoga movements can help the baby to descend into the birth canal and apply pressure on the cervix. Talk to [place] about which position may be most effective for you and your babys current position.     FOODS -- pineapple: rich in bromelain, an enzyme that can encourage the ripening of the cervix and simulate prostaglandin production             * eat this fresh daily, as canning and juicing destroys the bromelain -- black licorice candy (real): also known to stimulate prostaglandin production due to glycyrrhizin but can also cause mild diarrhea that may lead to sympthatetic uterine contractions, not much research here on this.    HERBS -- Basil, oregano, and thyme can help to coordinate your contractions if they are irregular and help to soften your cervix.  Make an eggplant parmesan or similar that uses these herbs or make a tea with one teaspoon of each and steep for 5 minutes.  Strain the leaves, add honey to sweeten it and sip the tea to welcome labor.    MASSAGE -- Prenatal massage increases blood circulation which can increase the circulation to the placenta and therefore the baby.  It also stimulates the lymph system to increase immunity and release toxins.  Massage can also increase endorphins into the mothers body to prepare her for an easier delivery with the sedating effects on the nervous system and promoting stress relief and relaxation.    RELAXATION/VISUALIZATION --The psyche is one of the 5 P's of labor (others are the passenger, passage, powers, and placenta). Fear and anxiety can stimmulate catecholamine release which can counteract the flow of hormones necessary in promoting  labor.  -- Choose your birth team and environment carefully so that you can feel relaxed and comfortable in the place of labor and birth.  Everyone should believe in you and your power to birth your baby.   -- Some choose to bring lights, battery candles, birth cards (that have photos or phrases of encouragement), essential oil diffusers, etc. to the labor room to help them stay relaxed in a new environment.

## 2023-06-27 NOTE — Progress Notes (Signed)
 ROB [redacted]w[redacted]d: She is doing well. She reports good fetal movement and has no new concerns today.

## 2023-06-27 NOTE — Addendum Note (Signed)
Addended by: Fabian November on: 06/27/2023 04:05 PM   Modules accepted: Orders

## 2023-06-30 ENCOUNTER — Encounter: Payer: Self-pay | Admitting: Obstetrics and Gynecology

## 2023-06-30 ENCOUNTER — Inpatient Hospital Stay
Admission: EM | Admit: 2023-06-30 | Discharge: 2023-07-02 | DRG: 806 | Disposition: A | Discharge status: Home / Self Care | Payer: MEDICAID | Attending: Obstetrics | Admitting: Obstetrics

## 2023-06-30 ENCOUNTER — Telehealth: Payer: Self-pay

## 2023-06-30 DIAGNOSIS — O99019 Anemia complicating pregnancy, unspecified trimester: Secondary | ICD-10-CM | POA: Diagnosis present

## 2023-06-30 DIAGNOSIS — O9902 Anemia complicating childbirth: Secondary | ICD-10-CM | POA: Diagnosis present

## 2023-06-30 DIAGNOSIS — Z87891 Personal history of nicotine dependence: Secondary | ICD-10-CM

## 2023-06-30 DIAGNOSIS — O4202 Full-term premature rupture of membranes, onset of labor within 24 hours of rupture: Secondary | ICD-10-CM

## 2023-06-30 DIAGNOSIS — Z8249 Family history of ischemic heart disease and other diseases of the circulatory system: Secondary | ICD-10-CM

## 2023-06-30 DIAGNOSIS — Z833 Family history of diabetes mellitus: Secondary | ICD-10-CM

## 2023-06-30 DIAGNOSIS — F1290 Cannabis use, unspecified, uncomplicated: Secondary | ICD-10-CM | POA: Diagnosis present

## 2023-06-30 DIAGNOSIS — O26893 Other specified pregnancy related conditions, third trimester: Secondary | ICD-10-CM | POA: Diagnosis present

## 2023-06-30 DIAGNOSIS — Z3A38 38 weeks gestation of pregnancy: Secondary | ICD-10-CM | POA: Diagnosis not present

## 2023-06-30 DIAGNOSIS — O99214 Obesity complicating childbirth: Secondary | ICD-10-CM | POA: Diagnosis present

## 2023-06-30 DIAGNOSIS — O4292 Full-term premature rupture of membranes, unspecified as to length of time between rupture and onset of labor: Principal | ICD-10-CM | POA: Diagnosis present

## 2023-06-30 DIAGNOSIS — O99324 Drug use complicating childbirth: Secondary | ICD-10-CM | POA: Diagnosis present

## 2023-06-30 DIAGNOSIS — O26899 Other specified pregnancy related conditions, unspecified trimester: Secondary | ICD-10-CM

## 2023-06-30 DIAGNOSIS — Z6791 Unspecified blood type, Rh negative: Secondary | ICD-10-CM

## 2023-06-30 NOTE — OB Triage Note (Signed)
Pt presents with UCs and possible SROM. G1P0 at 38.3w. Pt states UCs all day and around 2200 on 2/20 she had a lg gush of fluid that filled a peri pad. States still leaking now. States uncomplicated pregnancy. FHR 150

## 2023-06-30 NOTE — Telephone Encounter (Signed)
Patient states she lost her mucus plug last night and has been having ctx lasting 1 minute long ~6-7 min apart. Advised losing mucus plug normal to continue to monitor and report to hospital when ctx 4 minutes apart consistently for 1 hr or if she starts leaking fluid/having vaginal bleeding.

## 2023-07-01 ENCOUNTER — Inpatient Hospital Stay: Payer: MEDICAID | Admitting: Anesthesiology

## 2023-07-01 ENCOUNTER — Encounter: Payer: Self-pay | Admitting: Obstetrics and Gynecology

## 2023-07-01 ENCOUNTER — Other Ambulatory Visit: Payer: Self-pay

## 2023-07-01 DIAGNOSIS — O9902 Anemia complicating childbirth: Secondary | ICD-10-CM

## 2023-07-01 DIAGNOSIS — O360130 Maternal care for anti-D [Rh] antibodies, third trimester, not applicable or unspecified: Secondary | ICD-10-CM

## 2023-07-01 DIAGNOSIS — O26893 Other specified pregnancy related conditions, third trimester: Secondary | ICD-10-CM | POA: Diagnosis present

## 2023-07-01 DIAGNOSIS — F1290 Cannabis use, unspecified, uncomplicated: Secondary | ICD-10-CM | POA: Diagnosis present

## 2023-07-01 DIAGNOSIS — O4202 Full-term premature rupture of membranes, onset of labor within 24 hours of rupture: Secondary | ICD-10-CM | POA: Diagnosis not present

## 2023-07-01 DIAGNOSIS — Z8249 Family history of ischemic heart disease and other diseases of the circulatory system: Secondary | ICD-10-CM | POA: Diagnosis not present

## 2023-07-01 DIAGNOSIS — Z833 Family history of diabetes mellitus: Secondary | ICD-10-CM | POA: Diagnosis not present

## 2023-07-01 DIAGNOSIS — D649 Anemia, unspecified: Secondary | ICD-10-CM | POA: Diagnosis not present

## 2023-07-01 DIAGNOSIS — Z3A38 38 weeks gestation of pregnancy: Secondary | ICD-10-CM | POA: Diagnosis not present

## 2023-07-01 DIAGNOSIS — Z87891 Personal history of nicotine dependence: Secondary | ICD-10-CM | POA: Diagnosis not present

## 2023-07-01 DIAGNOSIS — O99214 Obesity complicating childbirth: Secondary | ICD-10-CM | POA: Diagnosis present

## 2023-07-01 DIAGNOSIS — O99324 Drug use complicating childbirth: Secondary | ICD-10-CM | POA: Diagnosis present

## 2023-07-01 DIAGNOSIS — O4292 Full-term premature rupture of membranes, unspecified as to length of time between rupture and onset of labor: Secondary | ICD-10-CM | POA: Diagnosis present

## 2023-07-01 DIAGNOSIS — Z6791 Unspecified blood type, Rh negative: Secondary | ICD-10-CM | POA: Diagnosis not present

## 2023-07-01 LAB — CBC
HCT: 25.3 % — ABNORMAL LOW (ref 36.0–46.0)
Hemoglobin: 8.2 g/dL — ABNORMAL LOW (ref 12.0–15.0)
MCH: 25.9 pg — ABNORMAL LOW (ref 26.0–34.0)
MCHC: 32.4 g/dL (ref 30.0–36.0)
MCV: 79.8 fL — ABNORMAL LOW (ref 80.0–100.0)
Platelets: 383 10*3/uL (ref 150–400)
RBC: 3.17 MIL/uL — ABNORMAL LOW (ref 3.87–5.11)
RDW: 15.5 % (ref 11.5–15.5)
WBC: 18.1 10*3/uL — ABNORMAL HIGH (ref 4.0–10.5)
nRBC: 0.2 % (ref 0.0–0.2)

## 2023-07-01 LAB — COMPREHENSIVE METABOLIC PANEL
ALT: 16 U/L (ref 0–44)
AST: 13 U/L — ABNORMAL LOW (ref 15–41)
Albumin: 2.7 g/dL — ABNORMAL LOW (ref 3.5–5.0)
Alkaline Phosphatase: 163 U/L — ABNORMAL HIGH (ref 38–126)
Anion gap: 10 (ref 5–15)
BUN: 8 mg/dL (ref 6–20)
CO2: 21 mmol/L — ABNORMAL LOW (ref 22–32)
Calcium: 9.1 mg/dL (ref 8.9–10.3)
Chloride: 102 mmol/L (ref 98–111)
Creatinine, Ser: 0.56 mg/dL (ref 0.44–1.00)
GFR, Estimated: >60 mL/min (ref >60)
Glucose, Bld: 91 mg/dL (ref 70–99)
Potassium: 3.9 mmol/L (ref 3.5–5.1)
Sodium: 133 mmol/L — ABNORMAL LOW (ref 135–145)
Total Bilirubin: 0.4 mg/dL (ref 0.0–1.2)
Total Protein: 6.4 g/dL — ABNORMAL LOW (ref 6.5–8.1)

## 2023-07-01 LAB — URINE DRUG SCREEN, QUALITATIVE (ARMC ONLY)
Amphetamines, Ur Screen: NOT DETECTED
Barbiturates, Ur Screen: NOT DETECTED
Benzodiazepine, Ur Scrn: NOT DETECTED
Cannabinoid 50 Ng, Ur ~~LOC~~: POSITIVE — AB
Cocaine Metabolite,Ur ~~LOC~~: NOT DETECTED
MDMA (Ecstasy)Ur Screen: NOT DETECTED
Methadone Scn, Ur: NOT DETECTED
Opiate, Ur Screen: NOT DETECTED
Phencyclidine (PCP) Ur S: NOT DETECTED
Tricyclic, Ur Screen: NOT DETECTED

## 2023-07-01 LAB — TYPE AND SCREEN
ABO/RH(D): AB NEG
Antibody Screen: NEGATIVE

## 2023-07-01 LAB — RPR: RPR Ser Ql: NONREACTIVE

## 2023-07-01 MED ORDER — LIDOCAINE-EPINEPHRINE (PF) 1.5 %-1:200000 IJ SOLN
INTRAMUSCULAR | Status: DC | PRN
  Administered 2023-07-01: 3 mL via EPIDURAL

## 2023-07-01 MED ORDER — TRANEXAMIC ACID-NACL 1000-0.7 MG/100ML-% IV SOLN
INTRAVENOUS | Status: AC
  Filled 2023-07-01: qty 100

## 2023-07-01 MED ORDER — OXYTOCIN-SODIUM CHLORIDE 30-0.9 UT/500ML-% IV SOLN: 2.5000 [IU]/h | INTRAVENOUS | Status: DC

## 2023-07-01 MED ORDER — ONDANSETRON HCL 4 MG/2ML IJ SOLN
4.0000 mg | Freq: Four times a day (QID) | INTRAMUSCULAR | Status: DC | PRN
  Administered 2023-07-01 (×2): 4 mg via INTRAVENOUS
  Filled 2023-07-01 (×2): qty 2

## 2023-07-01 MED ORDER — MEDROXYPROGESTERONE ACETATE 150 MG/ML IM SUSP: 150.0000 mg | INTRAMUSCULAR | Status: DC | PRN

## 2023-07-01 MED ORDER — HYDROXYZINE HCL 25 MG PO TABS: 50.0000 mg | ORAL_TABLET | Freq: Four times a day (QID) | ORAL | Status: DC | PRN

## 2023-07-01 MED ORDER — ACETAMINOPHEN 325 MG PO TABS: 650.0000 mg | ORAL_TABLET | ORAL | Status: DC | PRN

## 2023-07-01 MED ORDER — ONDANSETRON HCL 4 MG/2ML IJ SOLN: 4.0000 mg | INTRAMUSCULAR | Status: DC | PRN

## 2023-07-01 MED ORDER — BUPIVACAINE HCL (PF) 0.25 % IJ SOLN
INTRAMUSCULAR | Status: DC | PRN
  Administered 2023-07-01: 8 mL via EPIDURAL

## 2023-07-01 MED ORDER — TERBUTALINE SULFATE 1 MG/ML IJ SOLN: 0.2500 mg | Freq: Once | INTRAMUSCULAR | Status: DC | PRN

## 2023-07-01 MED ORDER — OXYTOCIN BOLUS FROM INFUSION
333.0000 mL | Freq: Once | INTRAVENOUS | Status: AC
  Administered 2023-07-01: 333 mL via INTRAVENOUS

## 2023-07-01 MED ORDER — COCONUT OIL OIL
1.0000 | TOPICAL_OIL | Status: DC | PRN
  Filled 2023-07-01: qty 7.5

## 2023-07-01 MED ORDER — ACETAMINOPHEN 500 MG PO TABS
1000.0000 mg | ORAL_TABLET | Freq: Four times a day (QID) | ORAL | Status: DC
  Administered 2023-07-01 – 2023-07-02 (×4): 1000 mg via ORAL
  Filled 2023-07-01 (×4): qty 2

## 2023-07-01 MED ORDER — LIDOCAINE HCL (PF) 1 % IJ SOLN
30.0000 mL | INTRAMUSCULAR | Status: AC | PRN
  Administered 2023-07-01: 30 mL via SUBCUTANEOUS
  Filled 2023-07-01: qty 30

## 2023-07-01 MED ORDER — PHENYLEPHRINE 80 MCG/ML (10ML) SYRINGE FOR IV PUSH (FOR BLOOD PRESSURE SUPPORT): 80.0000 ug | PREFILLED_SYRINGE | INTRAVENOUS | Status: DC | PRN

## 2023-07-01 MED ORDER — OXYTOCIN-SODIUM CHLORIDE 30-0.9 UT/500ML-% IV SOLN
1.0000 m[IU]/min | INTRAVENOUS | Status: DC
  Administered 2023-07-01: 2 m[IU]/min via INTRAVENOUS
  Filled 2023-07-01: qty 500

## 2023-07-01 MED ORDER — DIPHENHYDRAMINE HCL 25 MG PO CAPS: 25.0000 mg | ORAL_CAPSULE | Freq: Four times a day (QID) | ORAL | Status: DC | PRN

## 2023-07-01 MED ORDER — METHYLERGONOVINE MALEATE 0.2 MG/ML IJ SOLN
INTRAMUSCULAR | Status: AC
  Filled 2023-07-01: qty 1

## 2023-07-01 MED ORDER — BENZOCAINE-MENTHOL 20-0.5 % EX AERO
1.0000 | INHALATION_SPRAY | CUTANEOUS | Status: DC | PRN
  Administered 2023-07-01: 1 via TOPICAL
  Filled 2023-07-01: qty 56

## 2023-07-01 MED ORDER — LACTATED RINGERS IV SOLN
500.0000 mL | Freq: Once | INTRAVENOUS | Status: AC
  Administered 2023-07-01: 500 mL via INTRAVENOUS

## 2023-07-01 MED ORDER — FENTANYL-BUPIVACAINE-NACL 0.5-0.125-0.9 MG/250ML-% EP SOLN
12.0000 mL/h | EPIDURAL | Status: DC | PRN
  Administered 2023-07-01: 12 mL/h via EPIDURAL
  Filled 2023-07-01: qty 250

## 2023-07-01 MED ORDER — DIBUCAINE (PERIANAL) 1 % EX OINT: 1.0000 | TOPICAL_OINTMENT | CUTANEOUS | Status: DC | PRN

## 2023-07-01 MED ORDER — IBUPROFEN 600 MG PO TABS
600.0000 mg | ORAL_TABLET | Freq: Four times a day (QID) | ORAL | Status: DC
  Administered 2023-07-01 – 2023-07-02 (×5): 600 mg via ORAL
  Filled 2023-07-01 (×5): qty 1

## 2023-07-01 MED ORDER — SIMETHICONE 80 MG PO CHEW: 80.0000 mg | CHEWABLE_TABLET | ORAL | Status: DC | PRN

## 2023-07-01 MED ORDER — LACTATED RINGERS IV SOLN: INTRAVENOUS | Status: AC

## 2023-07-01 MED ORDER — ONDANSETRON HCL 4 MG PO TABS
4.0000 mg | ORAL_TABLET | ORAL | Status: DC | PRN
  Administered 2023-07-01: 4 mg via ORAL
  Filled 2023-07-01: qty 1

## 2023-07-01 MED ORDER — ZOLPIDEM TARTRATE 5 MG PO TABS: 5.0000 mg | ORAL_TABLET | Freq: Every evening | ORAL | Status: DC | PRN

## 2023-07-01 MED ORDER — CALCIUM CARBONATE ANTACID 500 MG PO CHEW: 2.0000 | CHEWABLE_TABLET | ORAL | Status: DC | PRN

## 2023-07-01 MED ORDER — EPHEDRINE 5 MG/ML INJ: 10.0000 mg | INTRAVENOUS | Status: DC | PRN

## 2023-07-01 MED ORDER — DIPHENHYDRAMINE HCL 50 MG/ML IJ SOLN: 12.5000 mg | INTRAMUSCULAR | Status: DC | PRN

## 2023-07-01 MED ORDER — FERROUS SULFATE 325 (65 FE) MG PO TABS
325.0000 mg | ORAL_TABLET | Freq: Two times a day (BID) | ORAL | Status: DC
  Administered 2023-07-01 – 2023-07-02 (×2): 325 mg via ORAL
  Filled 2023-07-01 (×2): qty 1

## 2023-07-01 MED ORDER — DOCUSATE SODIUM 100 MG PO CAPS
100.0000 mg | ORAL_CAPSULE | Freq: Two times a day (BID) | ORAL | Status: DC
  Administered 2023-07-01 – 2023-07-02 (×2): 100 mg via ORAL
  Filled 2023-07-01 (×2): qty 1

## 2023-07-01 MED ORDER — PRENATAL MULTIVITAMIN CH
1.0000 | ORAL_TABLET | Freq: Every day | ORAL | Status: DC
  Administered 2023-07-02: 1 via ORAL
  Filled 2023-07-01: qty 1

## 2023-07-01 MED ORDER — FENTANYL CITRATE (PF) 100 MCG/2ML IJ SOLN: 50.0000 ug | INTRAMUSCULAR | Status: DC | PRN

## 2023-07-01 MED ORDER — LACTATED RINGERS IV SOLN: 500.0000 mL | INTRAVENOUS | Status: AC | PRN

## 2023-07-01 MED ORDER — WITCH HAZEL-GLYCERIN EX PADS
1.0000 | MEDICATED_PAD | CUTANEOUS | Status: DC | PRN
  Administered 2023-07-01: 1 via TOPICAL
  Filled 2023-07-01: qty 100

## 2023-07-01 NOTE — Anesthesia Procedure Notes (Signed)
 Epidural Patient location during procedure: OB Start time: 07/01/2023 2:23 AM End time: 07/01/2023 2:32 AM  Staffing Anesthesiologist: [name], MD Performed: anesthesiologist   Preanesthetic Checklist Completed: patient identified, IV checked, risks and benefits discussed, surgical consent, monitors and equipment checked, pre-op evaluation and timeout performed  Epidural Patient position: sitting Prep: ChloraPrep Patient monitoring: heart rate, continuous pulse ox and blood pressure Approach: midline Location: L2-L3 Injection technique: LOR air  Needle:  Needle type: Tuohy  Needle gauge: 17 G Needle length: 9 cm Needle insertion depth: 6 cm Catheter at skin depth: 11 cm Test dose: negative and 1.5% lidocaine with Epi 1:200 K  Assessment Sensory level: T4  Additional Notes Straightforward placement without apparent complications. Reason for block:procedure for pain

## 2023-07-01 NOTE — Progress Notes (Signed)
 Labor Progress Note   ASSESSMENT/PLAN   Elenor Quinones 23 y.o.   G1P0  at [redacted]w[redacted]d here for SROM.  FWB:  - Fetal well being assessed: Category 1        GBS: - GBS negative  LABOR: - Fully dilated, ready to push - Pain Management: Epidural - Pitocin at 45mU/min - Baseline change to 110s - Anticipate SVD   Labor Progress 0003: 3/100/-2 0320: 6/100/-1 0457: 8/100/+1, slight swelling on right side 0700: C/C/+1  Principal Problem:   Labor and delivery, indication for care Active Problems:   Anemia affecting pregnancy     Overview: 28 wks: Hgb 10.3   SUBJECTIVE/OBJECTIVE   SUBJECTIVE:  Jamileth is feeling some pressure.   OBJECTIVE: Vital Signs: Patient Vitals for the past 12 hrs:  BP Temp Temp src Pulse Resp SpO2 Height Weight  07/01/23 0709 -- 97.7 F (36.5 C) Oral -- -- -- -- --  07/01/23 0707 117/67 -- -- (!) 111 18 100 % -- --  07/01/23 0650 -- -- -- -- -- 99 % -- --  07/01/23 0649 (!) 111/59 -- -- 79 -- -- -- --  07/01/23 0619 113/64 97.7 F (36.5 C) Oral 85 -- -- -- --  07/01/23 0605 -- -- -- -- -- 95 % -- --  07/01/23 0550 (!) 107/48 -- -- 76 -- 94 % -- --  07/01/23 0520 (!) 122/46 -- -- 79 -- 97 % -- --  07/01/23 0500 -- -- -- -- -- 100 % -- --  07/01/23 0450 -- -- -- -- -- 99 % -- --  07/01/23 0449 122/71 -- -- 91 -- -- -- --  07/01/23 0425 -- -- -- -- -- 98 % -- --  07/01/23 0421 105/72 98.2 F (36.8 C) Oral 81 -- -- -- --  07/01/23 0350 -- -- -- -- -- 98 % -- --  07/01/23 0349 121/69 -- -- 74 -- -- -- --  07/01/23 0320 (!) 107/58 -- -- 94 -- 99 % -- --  07/01/23 0315 101/66 -- -- 98 -- 98 % -- --  07/01/23 0310 -- -- -- -- -- 98 % -- --  07/01/23 0305 119/73 -- -- 97 -- 98 % -- --  07/01/23 0245 123/68 98.4 F (36.9 C) Oral (!) 115 14 100 % -- --  07/01/23 0243 123/68 -- -- (!) 115 -- 100 % -- --  07/01/23 0241 120/72 -- -- 94 -- 100 % -- --  07/01/23 0235 127/73 -- -- 83 14 100 % -- --  07/01/23 0230 137/84 -- -- 85 -- 100 % -- --  07/01/23 0226  -- -- -- -- -- 100 % -- --  07/01/23 0220 -- -- -- -- -- 100 % -- --  07/01/23 0215 -- -- -- -- -- 100 % -- --  07/01/23 0212 135/80 98.4 F (36.9 C) Oral 79 18 -- -- --  06/30/23 2357 -- 98.5 F (36.9 C) Oral 98 16 -- 5\' 2"  (1.575 m) 88.7 kg  06/30/23 2356 125/79 -- -- (!) 101 -- -- -- --    Last SVE:  Dilation: 10 Dilation Complete Date: 07/01/23 Dilation Complete Time: 0705 Effacement (%): 100 Station: Plus 1 Presentation: Vertex Exam by:: Quitman Livings CNM -  , Rupture Date: 06/30/23, Rupture Time: 2200,    FHR:   - Baseline: 110 - Variability: moderate - Accels: present - Decels: none  Category 1  UTERINE ACTIVITY:  Contractions: q2-3 min  Glenetta Borg, CNM

## 2023-07-01 NOTE — H&P (Signed)
 [facility] Labor & Delivery  History and Physical  ASSESSMENT AND PLAN   [name] is a 23 y.o. G1P0 at [redacted]w[redacted]d with EDD: 07/11/2023, Date entered prior to episode creation admitted for PROM with light meconium. We discussed options, and Robin Pineda would like to proceed with augmentation of labor at this time.  1. Admit to L&D  2. EFM: Continuous -- Category 1 3. Augmentation of labor with Pitocin 4. Pharmacologic pain relief if desired.   5. Admission labs  6. Anticipate NSVD 7. Dr.Evans notified of admission  Fetal Status: - cephalic presentation by Leopold's - EFW: 7 lbs by Leopold's - FHT currently category 1  Labs/Immunizations: Prenatal labs and studies: ABO, Rh: AB/Negative/-- 01/30/24 1157) Antibody: Negative (12/10 1006) Rubella: 3.94 01/30/2024 1157) Varicella: 779 01/30/24 1157)  RPR: Non Reactive (12/10 1006)  HBsAg: Negative 2024/01/30 1157)  HepC: Non Reactive 30-Jan-2024 1157) HIV: Non Reactive (12/10 1006)  GBS: Negative/-- (02/04 1540)   TDAP: given prenatally Flu: no RSV: 04/18/23  Postpartum Plan: - Feeding: Breast Milk and Formula - Contraception: plans Depo-Provera - Prenatal Care Provider: AOB    HPI   Chief Complaint: "I think my water broke"  Robin Pineda is a 23 y.o. G1P0 at [redacted]w[redacted]d who presents for contractions since 0900 and possible SROM. She reports that she had contractions that have been about 8 minutes apart. She felt a large gush of fluid around 2200 and has continued to leak yellowish fluid. She denies vaginal bleeding. She endorses good fetal movement. Her pregnancy has been complicated by THC use, N/V, and bipolar disorder. She was noted to have elevated liver enzymes after a recent GI virus.  .  Pregnancy Complications Patient Active Problem List   Diagnosis Date Noted   Labor and delivery, indication for care 07/01/2023   Abdominal pain affecting pregnancy 06/23/2023   Anemia affecting pregnancy 05/17/2023   Rh  negative state in antepartum period 02/24/2023   Bipolar disorder (HCC) 2023/01/30   Supervision of normal pregnancy 11/25/2022   MDD (major depressive disorder), recurrent, severe, 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   Migraine without aura and without status migrainosus, not intractable 03/04/2014   Episodic tension-type headache 03/04/2014   Obesity 03/04/2014   Disequilibrium 03/04/2014    Review of Systems A twelve point review of systems was negative except as stated in HPI.   HISTORY   Medications Medications Prior to Admission  Medication Sig Dispense Refill Last Dose/Taking   famotidine (PEPCID) 20 MG tablet Take 1 tablet (20 mg total) by mouth 2 (two) times daily. 60 tablet 3    ondansetron (ZOFRAN-ODT) 4 MG disintegrating tablet Take 1 tablet (4 mg total) by mouth every 8 (eight) hours as needed for nausea or vomiting. 30 tablet 1    Prenat-FeCbn-FeAsp-Meth-FA-DHA (PRENATE MINI) 18-0.6-0.4-350 MG CAPS Take 1 tablet by mouth daily. 90 capsule 4     Allergies is allergic to cinnamon and cinnamon.   OB History OB History  Gravida Para Term Preterm AB Living  1 0 0 0 0 0  SAB IAB Ectopic Multiple Live Births  0 0 0 0 0    # Outcome Date GA Lbr Len/2nd Weight Sex Type Anes PTL Lv  1 Current             Past Medical History Past Medical History:  Diagnosis Date   Anxiety    Arm fracture, left    Arm fracture, right    Asthma    Broken nose  Depression    Enlarged tonsils    Headache    POTS (postural orthostatic tachycardia syndrome)    Seasonal allergies    UTI (urinary tract infection)     Past Surgical History Past Surgical History:  Procedure Laterality Date   OTHER SURGICAL HISTORY Right 2011   Pins placed to repair break in right arm   TONSILECTOMY, ADENOIDECTOMY, BILATERAL MYRINGOTOMY AND TUBES     TONSILLECTOMY      Social History  reports that she quit smoking about 8 months ago. Her smoking use included cigarettes. She has never used  smokeless tobacco. She reports current drug use. Frequency: 7.00 times per week. Drug: Marijuana. She reports that she does not drink alcohol.   Family History family history includes Diabetes in her father; Healthy in her brother, maternal grandfather, maternal grandmother, and paternal grandfather; Heart failure in her father; Hypertension in her father; Lupus in her mother; Other in her brother and paternal grandmother.   PHYSICAL EXAM   Vitals:   06/30/23 2357  Pulse: 98  Resp: 16  Temp: 98.5 F (36.9 C)  TempSrc: Oral  Weight: 88.7 kg  Height: 5\' 2"  (1.575 m)    Constitutional: No acute distress, well appearing, and well nourished. Neurologic: She is alert and conversational.  Psychiatric: She has a normal mood and affect.  Musculoskeletal: Normal gait, grossly normal range of motion Cardiovascular: Normal rate.   Pulmonary/Chest: Normal work of breathing.  Gastrointestinal/Abdominal: Soft. Gravid. There is no tenderness.  Skin: Skin is warm and dry. No rash noted.  Genitourinary: Normal external female genitalia.  SVE:   Dilation: 3 Effacement (%): 100 Station: -2 Presentation: Vertex Exam by:: [email or web address]   NST Interpretation  Baseline: 145 bpm Variability: moderate Accelerations: present Decelerations: none Contractions: irregular, every 6-8 minutes Impression: reactive   Glenetta Borg, CNM

## 2023-07-01 NOTE — Progress Notes (Signed)
 Robin Pineda is a 23 y.o. G1P0 at [redacted]w[redacted]d by with EDD of 07/11/2023 admitted for SROM with meconium stained fluid.   Subjective: Robin Pineda is getting relief from her epidural. She reports being tired and is resting in between contractions.   Objective: BP 132/72   Pulse 81   Temp 98.1 F (36.7 C) (Oral)   Resp 18   Ht 5\' 2"  (1.575 m)   Wt 88.7 kg   LMP 10/04/2022 (Approximate)   SpO2 100%   BMI 35.77 kg/m  No intake/output data recorded. Total I/O In: 779.2 [I.V.:724.2; Other:55] Out: 900 [Urine:900]  FHT:  FHR: 120 bpm, variability: moderate,  accelerations:  Present,  decelerations:  Present with pushing, consistent with head compression.  UC:   regular, every 2-4 minutes SVE:   Dilation: 10 Effacement (%): 100 Station: Plus 1 Exam by:: Quitman Livings CNM  Labs: Lab Results  Component Value Date   WBC 18.1 (H) 07/01/2023   HGB 8.2 (L) 07/01/2023   HCT 25.3 (L) 07/01/2023   MCV 79.8 (L) 07/01/2023   PLT 383 07/01/2023    Assessment / Plan: Augmentation of labor with pitocin.  SROM with meconium at 38 weeks 4 days gestation.   Labor:  slow descent with pushing. Pause pushing for 30 minutes (began pause at 0940) and continue to uptitrate pitocin.  Preeclampsia:   n/a Fetal Wellbeing:  Category II Pain Control:  Epidural I/D:  n/a Anticipated MOD:  NSVD  Eloise Levels, Student-MidWife 07/01/2023, 10:03 AM Carie Caddy, CNM present for all portions of care.

## 2023-07-01 NOTE — Discharge Summary (Signed)
 Postpartum Discharge Summary  Date of Service updated: 07/02/2023     Patient Name: Robin Pineda DOB: 04-18-2001 MRN: [id number]  Date of admission: 06/30/2023 Delivery date:07/01/2023 Delivering provider: Cindra Eves SNM, Carie Caddy CNM Date of discharge: 07/02/2023  Admitting diagnosis: Labor and delivery, indication for care [O75.9] Intrauterine pregnancy: [redacted]w[redacted]d     Secondary diagnosis:  Principal Problem:   Labor and delivery, indication for care Active Problems:   Rh negative state in antepartum period   Anemia affecting pregnancy   Postpartum care following vaginal delivery   Encounter for care or examination of lactating mother  Additional problems: NA    Discharge diagnosis: Term Pregnancy Delivered                                              Post partum procedures: Rhogam Augmentation: Pitocin Complications: Prolonged second stage   Hospital course: Onset of Labor With Vaginal Delivery      23 y.o. yo G1P1001 at [redacted]w[redacted]d was admitted for SROM, in early labor on 06/30/2023. Labor course was complicated by meconium stained fluid and prolonged second stage.   Membrane Rupture Time/Date: 10:00 PM,06/30/2023  Delivery Method:Vaginal, Spontaneous Operative Delivery:N/A Episiotomy: None Lacerations:  2nd degree See delivery note for details  Patient had a postpartum course complicated by NA.  She is ambulating, tolerating a regular diet, passing flatus, and urinating well. She reports both breast and formula feeding.  Patient is discharged home in stable condition on 07/02/23.  Newborn Data: Birth date:07/01/2023 Birth time:10:46 AM Gender:Female  Josiah Living status:Living Apgars:8 ,9  Weight:3180 g  Magnesium Sulfate received: No BMZ received: No Rhophylac:Yes MMR:N/A T-DaP:Given prenatally Flu: No RSV Vaccine received: Yes Transfusion:No Immunizations administered: Immunization History  Administered Date(s) Administered   Rsv, Bivalent, Protein  Subunit Rsvpref,pf Verdis Frederickson) 05/17/2023   Tdap 04/18/2023    Physical exam  Vitals:   07/01/23 2011 07/02/23 0020 07/02/23 0335 07/02/23 0716  BP: 121/60 111/62 107/60 120/66  Pulse: 80 81 80 98  Resp: 18 17 20 18   Temp: 98.7 F (37.1 C) 97.7 F (36.5 C) 98.4 F (36.9 C) 98.7 F (37.1 C)  TempSrc: Oral Oral Oral Oral  SpO2: 95% 99% 97% 99%  Weight:      Height:       General: alert, cooperative, and no distress Lochia: appropriate Uterine Fundus: firm Incision: N/A DVT Evaluation: No evidence of DVT seen on physical exam. Labs: Lab Results  Component Value Date   WBC 19.5 (H) 07/02/2023   HGB 7.6 (L) 07/02/2023   HCT 24.8 (L) 07/02/2023   MCV 82.4 07/02/2023   PLT 360 07/02/2023      Latest Ref Rng & Units 07/01/2023   12:51 AM  CMP  Glucose 70 - 99 mg/dL 91   BUN 6 - 20 mg/dL 8   Creatinine 8.29 - 5.62 mg/dL 1.30   Sodium 865 - 784 mmol/L 133   Potassium 3.5 - 5.1 mmol/L 3.9   Chloride 98 - 111 mmol/L 102   CO2 22 - 32 mmol/L 21   Calcium 8.9 - 10.3 mg/dL 9.1   Total Protein 6.5 - 8.1 g/dL 6.4   Total Bilirubin 0.0 - 1.2 mg/dL 0.4   Alkaline Phos 38 - 126 U/L 163   AST 15 - 41 U/L 13   ALT 0 - 44 U/L 16  Edinburgh Score:    07/01/2023    3:00 PM  Edinburgh Postnatal Depression Scale Screening Tool  I have been able to laugh and see the funny side of things. 0  I have looked forward with enjoyment to things. 0  I have blamed myself unnecessarily when things went wrong. 0  I have been anxious or worried for no good reason. 1  I have felt scared or panicky for no good reason. 0  Things have been getting on top of me. 1  I have been so unhappy that I have had difficulty sleeping. 0  I have felt sad or miserable. 0  I have been so unhappy that I have been crying. 0  The thought of harming myself has occurred to me. 0  Edinburgh Postnatal Depression Scale Total 2     After visit meds:  Allergies as of 07/02/2023       Reactions   Cinnamon Hives    Cinnamon Swelling        Medication List     STOP taking these medications    famotidine 20 MG tablet Commonly known as: PEPCID   ondansetron 4 MG disintegrating tablet Commonly known as: ZOFRAN-ODT       TAKE these medications    Prenate Mini 18-0.6-0.4-350 MG Caps Take 1 tablet by mouth daily.        Discharge home in stable condition Infant Feeding:  formula and breast Infant Disposition:home with mother Discharge instruction: per After Visit Summary and Postpartum booklet. Activity: Advance as tolerated. Pelvic rest for 6 weeks.  Diet: routine diet Anticipated Birth Control: Depo Postpartum Appointment:2 weeks virtual, 6 weeks in person Additional Postpartum F/U:  prn  Future Appointments: Future Appointments  Date Time Provider Department Center  07/04/2023  3:15 PM Slaughterbeck, Irving Burton, CNM [facility] None  07/05/2023 11:15 AM [facility] [place] 1 [facility] None  07/11/2023  3:15 PM Glenetta Borg, CNM [facility] None   Follow up Visit:  Follow-up Information     Shermaine Brigham, Courtney Heys, CNM. Schedule an appointment as soon as possible for a visit in 2 week(s).   Specialty: Obstetrics and Gynecology Why: 2 week virtual appointment 6 week in-person appointment Contact information: [address] [place] [address] [phone]                  Tresea Mall, CNM [facility] 07/02/2023 4:00 PM

## 2023-07-01 NOTE — Anesthesia Preprocedure Evaluation (Addendum)
 Anesthesia Evaluation  Patient identified by MRN, date of birth, ID band Patient awake    Reviewed: Allergy & Precautions, NPO status , Patient's Chart, lab work & pertinent test results  History of Anesthesia Complications Negative for: history of anesthetic complications  Airway Mallampati: III   Neck ROM: Full    Dental   Pulmonary former smoker (quit this pregnancy)   Pulmonary exam normal breath sounds clear to auscultation       Cardiovascular Exercise Tolerance: Good Normal cardiovascular exam Rhythm:Regular Rate:Normal  POTS  Echo 12/21/20: normal   Neuro/Psych  Headaches PSYCHIATRIC DISORDERS Anxiety Depression Bipolar Disorder   Daily marijuana use    GI/Hepatic negative GI ROS,,,  Endo/Other  negative endocrine ROS    Renal/GU negative Renal ROS     Musculoskeletal   Abdominal   Peds  Hematology  (+) Blood dyscrasia, anemia   Anesthesia Other Findings   Reproductive/Obstetrics                             Anesthesia Physical Anesthesia Plan  ASA: 2  Anesthesia Plan: Epidural   Post-op Pain Management:    Induction:   PONV Risk Score and Plan: 2 and Treatment may vary due to age or medical condition  Airway Management Planned: Natural Airway  Additional Equipment:   Intra-op Plan:   Post-operative Plan:   Informed Consent: I have reviewed the patients History and Physical, chart, labs and discussed the procedure including the risks, benefits and alternatives for the proposed anesthesia with the patient or authorized representative who has indicated his/her understanding and acceptance.     Dental Advisory Given  Plan Discussed with:   Anesthesia Plan Comments: (Patient reports no bleeding problems and no anticoagulant use.   Patient consented for risks of anesthesia including but not limited to:  - adverse reactions to medications - risk of bleeding,  infection and or nerve damage from epidural that could lead to paralysis - risk of headache or failed epidural - nerve damage due to positioning - that if epidural is used for C-section that there is a chance of epidural failure requiring spinal placement or conversion to GA - damage to heart, brain, lungs, other parts of body or loss of life  Patient voiced understanding and assent.)        Anesthesia Quick Evaluation

## 2023-07-01 NOTE — Lactation Note (Signed)
 This note was copied from a baby's chart. Lactation Consultation Note  Patient Name: Robin Pineda ZOXWR'U Date: 07/01/2023 Age:21 hours Reason for consult: Initial assessment;Primapara;Early term 37-38.6wks   Maternal Data Has patient been taught Hand Expression?: Yes Does the patient have breastfeeding experience prior to this delivery?: No  Feeding Mother's Current Feeding Choice: Breast Milk Assisted mom with positioning to feed sleepy baby for 2nd breastfeeding after birth, baby sl gaggy, mom positioned on right side with baby beside her, colostrum expressed from breast easily, after few attempts, baby latched easily and began nursing with swallows noted, still sl sleepy but mom shown how to stimulate baby to stay awake to nurse, when baby slips off breast, he latches back easily, still nursing at 10 min when room left  LATCH Score Latch: Grasps breast easily, tongue down, lips flanged, rhythmical sucking. (sleepy at first)  Audible Swallowing: Spontaneous and intermittent  Type of Nipple: Everted at rest and after stimulation  Comfort (Breast/Nipple): Soft / non-tender  Hold (Positioning): Assistance needed to correctly position infant at breast and maintain latch.  LATCH Score: 9   Lactation Tools Discussed/Used  LC name and no written on white board  Interventions Interventions: Breast feeding basics reviewed;Assisted with latch;Skin to skin;Hand express;Adjust position;Breast compression;Support pillows;Education  Discharge Pump: Personal WIC Program: Yes  Consult Status Consult Status: Follow-up Date: 07/02/23 Follow-up type: In-patient    Robin Pineda 07/01/2023, 4:02 PM

## 2023-07-01 NOTE — Plan of Care (Signed)

## 2023-07-01 NOTE — Progress Notes (Signed)
 Labor Progress Note   ASSESSMENT/PLAN   Robin Pineda 23 y.o.   G1P0  at [redacted]w[redacted]d here for PROM. Labor is currently being augmented with Pitocin.  FWB:  - Fetal well being assessed: Category 1/2        GBS: - GBS negative  LABOR: -  Active labor, progressing well. - Pain Management: Epidural - Anticipate SVD   Labor Progress 0003" 3/100/-2 0320: 6/100/-1 0457: 8/100/+1, slight swelling on right side Principal Problem:   Labor and delivery, indication for care Active Problems:   Anemia affecting pregnancy     Overview: 28 wks: Hgb 10.3   SUBJECTIVE/OBJECTIVE   SUBJECTIVE:  Robin Pineda is starting feel contractions in her tailbone. She is comfortable with her epidural.  OBJECTIVE: Vital Signs: Patient Vitals for the past 12 hrs:  BP Temp Temp src Pulse Resp SpO2 Height Weight  07/01/23 0450 -- -- -- -- -- 99 % -- --  07/01/23 0449 122/71 -- -- 91 -- -- -- --  07/01/23 0425 -- -- -- -- -- 98 % -- --  07/01/23 0421 105/72 98.2 F (36.8 C) Oral 81 -- -- -- --  07/01/23 0350 -- -- -- -- -- 98 % -- --  07/01/23 0349 121/69 -- -- 74 -- -- -- --  07/01/23 0320 (!) 107/58 -- -- 94 -- 99 % -- --  07/01/23 0315 101/66 -- -- 98 -- 98 % -- --  07/01/23 0310 -- -- -- -- -- 98 % -- --  07/01/23 0305 119/73 -- -- 97 -- 98 % -- --  07/01/23 0245 123/68 98.4 F (36.9 C) Oral (!) 115 14 100 % -- --  07/01/23 0243 123/68 -- -- (!) 115 -- 100 % -- --  07/01/23 0241 120/72 -- -- 94 -- 100 % -- --  07/01/23 0235 127/73 -- -- 83 14 100 % -- --  07/01/23 0230 137/84 -- -- 85 -- 100 % -- --  07/01/23 0226 -- -- -- -- -- 100 % -- --  07/01/23 0220 -- -- -- -- -- 100 % -- --  07/01/23 0215 -- -- -- -- -- 100 % -- --  07/01/23 0212 135/80 98.4 F (36.9 C) Oral 79 18 -- -- --  06/30/23 2357 -- 98.5 F (36.9 C) Oral 98 16 -- 5\' 2"  (1.575 m) 88.7 kg  06/30/23 2356 125/79 -- -- (!) 101 -- -- -- --    Last SVE:  Dilation: 8 Effacement (%): 100 Station: Plus 1 Presentation: Vertex Exam  by:: JDaley -  , Rupture Date: 06/30/23, Rupture Time: 2200,    FHR:   - Baseline: 120 - Variability: moderate - Accels: present - Decels: isolated lates Category 2  UTERINE ACTIVITY:  Contractions: q 2-3 minutes  Glenetta Borg, CNM

## 2023-07-01 NOTE — Discharge Instructions (Signed)

## 2023-07-02 LAB — CBC
HCT: 24.8 % — ABNORMAL LOW (ref 36.0–46.0)
Hemoglobin: 7.6 g/dL — ABNORMAL LOW (ref 12.0–15.0)
MCH: 25.2 pg — ABNORMAL LOW (ref 26.0–34.0)
MCHC: 30.6 g/dL (ref 30.0–36.0)
MCV: 82.4 fL (ref 80.0–100.0)
Platelets: 360 10*3/uL (ref 150–400)
RBC: 3.01 MIL/uL — ABNORMAL LOW (ref 3.87–5.11)
RDW: 15.7 % — ABNORMAL HIGH (ref 11.5–15.5)
WBC: 19.5 10*3/uL — ABNORMAL HIGH (ref 4.0–10.5)
nRBC: 0 % (ref 0.0–0.2)

## 2023-07-02 LAB — FETAL SCREEN: Fetal Screen: NEGATIVE

## 2023-07-02 MED ORDER — RHO D IMMUNE GLOBULIN 1500 UNIT/2ML IJ SOSY
300.0000 ug | PREFILLED_SYRINGE | Freq: Once | INTRAMUSCULAR | Status: AC
  Administered 2023-07-02: 300 ug via INTRAVENOUS
  Filled 2023-07-02: qty 2

## 2023-07-02 NOTE — Progress Notes (Signed)
 Discharge instructions given.  All questions answered.

## 2023-07-02 NOTE — Anesthesia Postprocedure Evaluation (Signed)
 Anesthesia Post Note  Patient: Robin Pineda  Procedure(s) Performed: AN AD HOC LABOR EPIDURAL  Patient location during evaluation: Mother Baby Anesthesia Type: Epidural Level of consciousness: awake and alert Pain management: pain level controlled Vital Signs Assessment: post-procedure vital signs reviewed and stable Respiratory status: spontaneous breathing, nonlabored ventilation and respiratory function stable Cardiovascular status: stable Postop Assessment: no headache, no backache and able to ambulate Anesthetic complications: no   No notable events documented.   Last Vitals:  Vitals:   07/02/23 0335 07/02/23 0716  BP: 107/60 120/66  Pulse: 80 98  Resp: 20 18  Temp: 36.9 C 37.1 C  SpO2: 97% 99%    Last Pain:  Vitals:   07/02/23 0716  TempSrc: Oral  PainSc:                  [name]

## 2023-07-02 NOTE — Progress Notes (Signed)
 [facility]  Subjective:  Doing well postpartum day 1; she is tolerating regular diet, pain is controlled with PO medications, ambulating and voiding without difficulty. She reports she is both formula and breast feeding.  Objective:  Vital signs in last 24 hours: Temp:  [97.7 F (36.5 C)-98.8 F (37.1 C)] 98.7 F (37.1 C) (02/23 0716) Pulse Rate:  [72-112] 98 (02/23 0716) Resp:  [16-20] 18 (02/23 0716) BP: (107-132)/(60-77) 120/66 (02/23 0716) SpO2:  [95 %-99 %] 99 % (02/23 0716)    General: NAD Pulmonary: no increased work of breathing Abdomen: non-distended, non-tender, fundus firm at level of umbilicus Extremities: no edema, no erythema, no tenderness  Results for orders placed or performed during the hospital encounter of 06/30/23 (from the past 72 hours)  CBC     Status: Abnormal   Collection Time: 07/01/23 12:51 AM  Result Value Ref Range   WBC 18.1 (H) 4.0 - 10.5 K/uL   RBC 3.17 (L) 3.87 - 5.11 MIL/uL   Hemoglobin 8.2 (L) 12.0 - 15.0 g/dL   HCT 91.4 (L) 78.2 - 95.6 %   MCV 79.8 (L) 80.0 - 100.0 fL   MCH 25.9 (L) 26.0 - 34.0 pg   MCHC 32.4 30.0 - 36.0 g/dL   RDW 21.3 08.6 - 57.8 %   Platelets 383 150 - 400 K/uL   nRBC 0.2 0.0 - 0.2 %    Comment: Performed at [facility], [address] [place], [place] [address]  RPR     Status: None   Collection Time: 07/01/23 12:51 AM  Result Value Ref Range   RPR Ser Ql NON REACTIVE NON REACTIVE    Comment: Performed at [facility], [address] [place], [place] [address]  Type and screen [facility]     Status: None   Collection Time: 07/01/23 12:51 AM  Result Value Ref Range   ABO/RH(D) AB NEG    Antibody Screen NEG    Sample Expiration      07/04/2023,2359 Performed at [facility], [address] [place], [place] [address]   Urine Drug Screen, Qualitative [facility] only)     Status: Abnormal   Collection Time: 07/01/23 12:51 AM  Result Value Ref Range    Tricyclic, Ur Screen NONE DETECTED NONE DETECTED   Amphetamines, Ur Screen NONE DETECTED NONE DETECTED   MDMA (Ecstasy)Ur Screen NONE DETECTED NONE DETECTED   Cocaine Metabolite,Ur [place]gron Torres NONE DETECTED NONE DETECTED   Opiate, Ur Screen NONE DETECTED NONE DETECTED   Phencyclidine (PCP) Ur S NONE DETECTED NONE DETECTED   Cannabinoid 50 Ng, Ur [place] POSITIVE (A) NONE DETECTED   Barbiturates, Ur Screen NONE DETECTED NONE DETECTED   Benzodiazepine, Ur Scrn NONE DETECTED NONE DETECTED   Methadone Scn, Ur NONE DETECTED NONE DETECTED    Comment: (NOTE) Tricyclics + metabolites, urine    Cutoff 1000 ng/mL Amphetamines + metabolites, urine  Cutoff 1000 ng/mL MDMA (Ecstasy), urine              Cutoff 500 ng/mL Cocaine Metabolite, urine          Cutoff 300 ng/mL Opiate + metabolites, urine        Cutoff 300 ng/mL Phencyclidine (PCP), urine         Cutoff 25 ng/mL Cannabinoid, urine                 Cutoff 50 ng/mL Barbiturates + metabolites, urine  Cutoff 200 ng/mL Benzodiazepine, urine  Cutoff 200 ng/mL Methadone, urine                   Cutoff 300 ng/mL  The urine drug screen provides only a preliminary, unconfirmed analytical test result and should not be used for non-medical purposes. Clinical consideration and professional judgment should be applied to any positive drug screen result due to possible interfering substances. A more specific alternate chemical method must be used in order to obtain a confirmed analytical result. Gas chromatography / mass spectrometry (GC/MS) is the preferred confirm atory method. Performed at [facility], [address] [place], [place] [address]   Comprehensive metabolic panel     Status: Abnormal   Collection Time: 07/01/23 12:51 AM  Result Value Ref Range   Sodium 133 (L) 135 - 145 mmol/L   Potassium 3.9 3.5 - 5.1 mmol/L   Chloride 102 98 - 111 mmol/L   CO2 21 (L) 22 - 32 mmol/L   Glucose, Bld 91 70 - 99 mg/dL    Comment: Glucose  reference range applies only to samples taken after fasting for at least 8 hours.   BUN 8 6 - 20 mg/dL   Creatinine, Ser 9.14 0.44 - 1.00 mg/dL   Calcium 9.1 8.9 - 78.2 mg/dL   Total Protein 6.4 (L) 6.5 - 8.1 g/dL   Albumin 2.7 (L) 3.5 - 5.0 g/dL   AST 13 (L) 15 - 41 U/L   ALT 16 0 - 44 U/L   Alkaline Phosphatase 163 (H) 38 - 126 U/L   Total Bilirubin 0.4 0.0 - 1.2 mg/dL   GFR, Estimated >95 >62 mL/min    Comment: (NOTE) Calculated using the CKD-EPI Creatinine Equation (2021)    Anion gap 10 5 - 15    Comment: Performed at [facility], [address] [place], [place] [address]  Fetal screen     Status: None   Collection Time: 07/02/23  5:58 AM  Result Value Ref Range   Fetal Screen      NEG Performed at [facility], [address] [place], [place] [address]   Rhogam injection     Status: None (Preliminary result)   Collection Time: 07/02/23  5:58 AM  Result Value Ref Range   Unit Number [id number]    Blood Component Type RHIG    Unit division 00    Status of Unit ALLOCATED    Transfusion Status OK TO TRANSFUSE   CBC     Status: Abnormal   Collection Time: 07/02/23  5:58 AM  Result Value Ref Range   WBC 19.5 (H) 4.0 - 10.5 K/uL   RBC 3.01 (L) 3.87 - 5.11 MIL/uL   Hemoglobin 7.6 (L) 12.0 - 15.0 g/dL   HCT 40.1 (L) 02.7 - 25.3 %   MCV 82.4 80.0 - 100.0 fL   MCH 25.2 (L) 26.0 - 34.0 pg   MCHC 30.6 30.0 - 36.0 g/dL   RDW 66.4 (H) 40.3 - 47.4 %   Platelets 360 150 - 400 K/uL   nRBC 0.0 0.0 - 0.2 %    Comment: Performed at [facility], [address], [place], [place] [address]    Assessment:   23 y.o. G1P1001 postpartum day # lactating  Plan:    1) Acute blood loss anemia - not clinically significant for this admission, hemodynamically stable and asymptomatic - po ferrous sulfate  2) Blood Type --/--/AB NEG (02/22 0051) / Rubella 3.94 (08/26 1157) / Varicella Immune  3) TDAP  status  given antepartum  4) Feeding plan  formula  and breast  5)  Education given regarding options for contraception, as well as compatibility with breast feeding if applicable.  Patient plans on Depo-Provera injections for contraception.  6) Disposition: continue current care   Tresea Mall, CNM [facility] 07/02/2023 12:02 PM

## 2023-07-02 NOTE — Clinical Social Work Maternal (Signed)
 CLINICAL SOCIAL WORK MATERNAL/CHILD NOTE  Patient Details  Name: Robin Pineda MRN: [id number] Date of Birth: 12-13-2000  Date:  07/02/2023  Clinical Social Worker Initiating Note:  Jakala Herford Date/Time: Initiated:  07/02/23/      Child's Name:  Robin Pineda (last name TBD either Blackwell-Rens or Arizmendi-Blackwell)   Biological Parents:  Mother, Father   Need for Interpreter:  None   Reason for Referral:  Current Substance Use/Substance Use During Pregnancy     Address:  [address] [place] [address]  -- MOB states she and Baby will be staying with her Father at [address], [place], [place].   Phone number:  [phone] (home)     Additional phone number:   Household Members/Support Persons (HM/SP):   Household Member/Support Person 1   HM/SP Name Relationship DOB or Age  HM/SP -1 Adam father unknown  HM/SP -2        HM/SP -3        HM/SP -4        HM/SP -5        HM/SP -6        HM/SP -7        HM/SP -8          Natural Supports (not living in the home):  Extended Family, Friends, Immediate Family, Spouse/significant other   Professional Supports:     Employment:     Type of Work:     Education:      Homebound arranged:    Surveyor, quantity Resources:  Medicaid   Other Resources:  [employer]   Cultural/Religious Considerations Which May Impact Care:    Strengths:  Ability to meet basic needs  , Home prepared for child  , Pediatrician chosen   Psychotropic Medications:         Pediatrician:    Armed forces operational officer area  Pediatrician List:   [facility]  [place]    [place]    [place] [facility]      Pediatrician Fax Number:    Risk Factors/Current Problems:      Cognitive State:  Alert  , Able to Concentrate     Mood/Affect:  Calm  , Happy     CSW Assessment:  CSW received a consult for MOB and Baby being positive for THC.  CSW spoke with RN, no additional  concerns.  CSW met with MOB at bedside. Explained CSW's role and reason for referral.  MOB reported she is feeling good post delivery. MOB was alert/appropriate/attentive to Baby during assessment.   Confirmed contact information for MOB. MOB and Baby will be living with her Father at [address] [place], [place] at discharge.   [facility] Drug Screen/CPS Report Policy. CSW called [facility] DSS - Receptionist stated they will have a SW call back to that report can be made.  MOB reported she receives [facility] and was reminded to inform her Workers [employer] birth. MOB plans to use [facility] for [facility]. MOB reported she has all items needed for Baby. MOB reported she has reliable transportation for herself and Baby. MOB denied resource needs at this time.   MOB reported she has depression, anxiety, and bipolar disorder. MOB stated she took medications prior to pregnancy, is not currently on any mental health medications. MOB stated she has seen a therapist in the past. MOB stated she is coping well emotionally at this time and  has a good support sytem. MOB denied SI, HI, or DV. MOB denied the need for mental health support resources at this time, and reported she is aware of resources if needed.  CSW provided education and information sheets on PPD and SIDS. MOB verbalized understanding. CSW ecouraged MOB to reach out to her Provider with any questions or needs for support or resources, even after discharge.   MOB denied any needs or questions at this time. CSW encouraged MOB to reach out if any arise prior to discharge.   Please re consult CSW if any additional needs or concerns arise.  CSW Plan/Description:  Sudden Infant Death Syndrome (SIDS) Education, Perinatal Mood and Anxiety Disorder (PMADs) Education, Hospital Drug Screen Policy Information, Child Protective Service Report  , CSW Awaiting CPS Disposition Plan, CSW Will Continue to Monitor Umbilical Cord  Tissue Drug Screen Results and Make Report if Ralene Cork, LCSW 07/02/2023, 10:07 AM

## 2023-07-02 NOTE — Lactation Note (Signed)
 This note was copied from a baby's chart. Lactation Consultation Note  Patient Name: Robin Pineda ZOXWR'U Date: 07/02/2023 Age:21 hours Reason for consult: Follow-up assessment  LC student visited G1P1 mother and 25 hour infant. Mother's updated goal is to combo feed with formula. Mother states breastfeeding has been challenging due to painful latch. Mother agreed the initial latch feels like a pinch. Breastfeeding assistance was offered for the next feeding time, before discharging. Proper latch signs and positioning discussed. Paced bottle feeding education provided and normal infant behavior expectations when formula feeding. Encouraged mother to pump when giving baby a bottle, to maintain milk production/supply. Engorgement signs and milk storage guidelines reviewed. Outpatient services provided and verbalizing we are here to support her journey. Support individuals present.   Maternal Data    Feeding Mother's Current Feeding Choice: Breast Milk and Formula  LATCH Score    Lactation Tools Discussed/Used    Interventions    Discharge Discharge Education: Engorgement and breast care;Outpatient recommendation;Warning signs for feeding baby  Consult Status Consult Status: PRN Date: 07/02/23 Follow-up type: Out-patient    [name] 07/02/2023, 12:28 PM

## 2023-07-02 NOTE — TOC CM/SW Note (Signed)
 CPS report made to [facility] with [facility] DSS. Notified Morrie Sheldon of DC today.  Alfonso Ramus, LCSW Transitions of Care Department [phone]

## 2023-07-03 LAB — RHOGAM INJECTION: Unit division: 0

## 2023-07-03 NOTE — Telephone Encounter (Signed)
 Robin Pineda

## 2023-07-03 NOTE — Telephone Encounter (Signed)
 Chart reviewed. Patient reported to L&D and has delivered.

## 2023-07-04 ENCOUNTER — Encounter: Payer: MEDICAID | Admitting: Certified Nurse Midwife

## 2023-07-05 ENCOUNTER — Other Ambulatory Visit: Payer: MEDICAID

## 2023-07-11 ENCOUNTER — Encounter: Payer: MEDICAID | Admitting: Obstetrics

## 2023-07-11 ENCOUNTER — Telehealth (INDEPENDENT_AMBULATORY_CARE_PROVIDER_SITE_OTHER): Payer: MEDICAID | Admitting: Obstetrics

## 2023-07-11 NOTE — Progress Notes (Signed)
   Virtual Visit via Video Note   I connected with Robin Pineda  on 07/11/23 2:28 PM by a video enabled telemedicine application and verified that I am speaking with the correct person using two identifiers.   Location: Patient: Car Provider: [facility] clinic   I discussed the limitations of evaluation and management by telemedicine and the availability of in person appointments. The patient expressed understanding and agreed to proceed. Subjective:    Subjective  Robin Pineda is a 23 y.o. female who presents for a postpartum visit. She is 10 days  postpartum following a spontaneous vaginal delivery. I have fully reviewed the prenatal and intrapartum course. The delivery was at [redacted]w[redacted]d.  Anesthesia: epidural.    Postpartum course has been normal. She has no pain and no other concerns. Baby's course has been normal. Baby is feeding by   expressed breast milk and formula . Bleeding  is light . Bowel function is normal. Bladder function is normal.  .  Contraception method: Depo-Provera injections. Postpartum depression screening: negative     Review of Systems Pertinent positives noted in HPI. Remainder of comprehensive ROS otherwise negative.     Objective:      Objective [] Expand by Default There were no vitals taken for this visit.  General:  alert, cooperative, and no distress        Assessment:    Normal postpartum course, mood stable.   Plan:      Plan -Anticipatory guidance about the postpartum period -Reviewed s/s of PPD and PPA -Discussed when to seek medical care -Office visit at 6 weeks postpartum  I discussed the assessment and treatment plan with the patient. The patient was provided an opportunity to ask questions and all were answered. The patient agreed with the plan and demonstrated an understanding of the instructions.   The patient was advised to call back or seek an in-person evaluation if the symptoms worsen or if the condition fails to improve as  anticipated.   I provided 5 minutes of non-face-to-face time during this encounter.     Glenetta Borg, CNM

## 2023-07-20 ENCOUNTER — Encounter: Payer: Self-pay | Admitting: Certified Nurse Midwife

## 2023-07-26 ENCOUNTER — Telehealth: Payer: MEDICAID | Admitting: Physician Assistant

## 2023-07-26 DIAGNOSIS — L0291 Cutaneous abscess, unspecified: Secondary | ICD-10-CM

## 2023-07-26 MED ORDER — AMOXICILLIN 875 MG PO TABS: 875.0000 mg | ORAL_TABLET | Freq: Two times a day (BID) | ORAL | 0 refills | Status: AC

## 2023-07-26 NOTE — Progress Notes (Addendum)
 Virtual Visit Consent   Robin Pineda, you are scheduled for a virtual visit with a [facility] provider today. Just as with appointments in the office, your consent must be obtained to participate. Your consent will be active for this visit and any virtual visit you may have with one of our providers in the next 365 days. If you have a MyChart account, a copy of this consent can be sent to you electronically.  As this is a virtual visit, video technology does not allow for your provider to perform a traditional examination. This may limit your provider's ability to fully assess your condition. If your provider identifies any concerns that need to be evaluated in person or the need to arrange testing (such as labs, EKG, etc.), we will make arrangements to do so. Although advances in technology are sophisticated, we cannot ensure that it will always work on either your end or our end. If the connection with a video visit is poor, the visit may have to be switched to a telephone visit. With either a video or telephone visit, we are not always able to ensure that we have a secure connection.  By engaging in this virtual visit, you consent to the provision of healthcare and authorize for your insurance to be billed (if applicable) for the services provided during this visit. Depending on your insurance coverage, you may receive a charge related to this service.  I need to obtain your verbal consent now. Are you willing to proceed with your visit today? Robin Pineda has provided verbal consent on 07/26/2023 for a virtual visit (video or telephone). Gilberto Better, [place]  Date: 07/26/2023 7:04 PM   Virtual Visit via Video Note   I, Aron Inge, connected with  Robin Pineda  ([id number], 08/12/2000) on 07/26/23 at  7:00 PM EDT by a video-enabled telemedicine application and verified that I am speaking with the correct person using two identifiers.  Location: Patient: Virtual Visit Location Patient:  Home Provider: Virtual Visit Location Provider: Home Office   I discussed the limitations of evaluation and management by telemedicine and the availability of in person appointments. The patient expressed understanding and agreed to proceed.    History of Present Illness: Robin Pineda is a 23 y.o. who identifies as a female who was assigned female at birth, and is being seen today for swelling and pain.  HPI: 23 y/o F presents via video telehealth visit for a questionable insect bite  on the right hip area x 5 days, but getting worse and now draining with purulent discharge. No fever. Pt is breastfeeding currently.     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   Migraine without aura and without status migrainosus, not intractable 03/04/2014   Episodic tension-type headache 03/04/2014   Obesity 03/04/2014   Disequilibrium 03/04/2014    Allergies:  Allergies  Allergen Reactions   Cinnamon Hives   Cinnamon Swelling   Medications:  Current Outpatient Medications:    amoxicillin (AMOXIL) 875 MG tablet, Take 1 tablet (875 mg total) by mouth 2 (two)  times daily for 10 days., Disp: 20 tablet, Rfl: 0   Prenat-FeCbn-FeAsp-Meth-FA-DHA (PRENATE MINI) 18-0.6-0.4-350 MG CAPS, Take 1 tablet by mouth daily., Disp: 90 capsule, Rfl: 4  Observations/Objective: Patient is well-developed, well-nourished in no acute distress.  Resting comfortably  at home.  Head is normocephalic, atraumatic.  No labored breathing.  Speech is clear and coherent  with logical content.  Patient is alert and oriented at baseline.    Assessment and Plan: 1. Abscess (Primary) - amoxicillin (AMOXIL) 875 MG tablet; Take 1 tablet (875 mg total) by mouth 2 (two) times daily for 10 days.  Dispense: 20 tablet; Refill: 0  Apply warm moist compresses to the affected area  every 2-3 hours. Start medicine as prescribed. Rx for Amoxicillin sent because patient is breastfeeding currently.  Continue to watch for worsening symptoms.  Consider seeking an urgent care face to face visit for incision and drainage.  Pt verbalized understanding and in agreement.    Follow Up Instructions: I discussed the assessment and treatment plan with the patient. The patient was provided an opportunity to ask questions and all were answered. The patient agreed with the plan and demonstrated an understanding of the instructions.  A copy of instructions were sent to the patient via MyChart unless otherwise noted below.   Patient has requested to receive PHI (AVS, Work Notes, etc) pertaining to this video visit through e-mail as they are currently without active MyChart. They have voiced understand that email is not considered secure and their health information could be viewed by someone other than the patient.   The patient was advised to call back or seek an in-person evaluation if the symptoms worsen or if the condition fails to improve as anticipated.    Gilberto Better, PA-C

## 2023-07-26 NOTE — Patient Instructions (Signed)
  Robin Pineda, thank you for joining Gilberto Better, PA-C for today's virtual visit.  While this provider is not your primary care provider (PCP), if your PCP is located in our provider database this encounter information will be shared with them immediately following your visit.   A [facility] MyChart account gives you access to today's visit and all your visits, tests, and labs performed at [facility] " click here if you don't have a [facility] MyChart account or go to [email or web address]  Consent: (Patient) Robin Pineda provided verbal consent for this virtual visit at the beginning of the encounter.  Current Medications:  Current Outpatient Medications:    amoxicillin (AMOXIL) 875 MG tablet, Take 1 tablet (875 mg total) by mouth 2 (two) times daily for 10 days., Disp: 20 tablet, Rfl: 0   Prenat-FeCbn-FeAsp-Meth-FA-DHA (PRENATE MINI) 18-0.6-0.4-350 MG CAPS, Take 1 tablet by mouth daily., Disp: 90 capsule, Rfl: 4   Medications ordered in this encounter:  Meds ordered this encounter  Medications   amoxicillin (AMOXIL) 875 MG tablet    Sig: Take 1 tablet (875 mg total) by mouth 2 (two) times daily for 10 days.    Dispense:  20 tablet    Refill:  0    Supervising Provider:   Merrilee Jansky [[id number]]     *If you need refills on other medications prior to your next appointment, please contact your pharmacy*  Follow-Up: Call back or seek an in-person evaluation if the symptoms worsen or if the condition fails to improve as anticipated.  [facility] [phone]  Other Instructions Apply warm moist compresses to the affected area  every 2-3 hours. Start medicine as prescribed. Continue to watch for worsening symptoms.  Consider seeking an urgent care face to face visit for incision and drainage.    If you have been instructed to have an in-person evaluation today at a local Urgent Care facility, please use the link below. It will take you  to a list of all of our available [facility], including address, phone number and hours of operation. Please do not delay care.  [facility]  If you or a family member do not have a primary care provider, use the link below to schedule a visit and establish care. When you choose a [facility] primary care physician or advanced practice provider, you gain a long-term partner in health. Find a Primary Care Provider  Learn more about [facility] in-office and virtual care options:  - Get Care Now

## 2023-08-07 ENCOUNTER — Encounter: Payer: Self-pay | Admitting: Obstetrics

## 2023-08-07 ENCOUNTER — Ambulatory Visit (INDEPENDENT_AMBULATORY_CARE_PROVIDER_SITE_OTHER): Payer: MEDICAID | Admitting: Obstetrics

## 2023-08-07 VITALS — BP 121/69 | HR 65 | Ht 62.0 in | Wt 169.0 lb

## 2023-08-07 DIAGNOSIS — Z3202 Encounter for pregnancy test, result negative: Secondary | ICD-10-CM

## 2023-08-07 DIAGNOSIS — Z3042 Encounter for surveillance of injectable contraceptive: Secondary | ICD-10-CM

## 2023-08-07 LAB — POCT URINE PREGNANCY: Preg Test, Ur: NEGATIVE

## 2023-08-07 MED ORDER — MEDROXYPROGESTERONE ACETATE 150 MG/ML IM SUSP
150.0000 mg | Freq: Once | INTRAMUSCULAR | Status: AC
  Administered 2023-08-07: 150 mg via INTRAMUSCULAR

## 2023-08-07 NOTE — Progress Notes (Addendum)
 Post Partum Visit Note  Robin Pineda is a 23 y.o. G48P1001 female who presents for a postpartum visit. She is 6 weeks postpartum following a normal spontaneous vaginal delivery.  I have fully reviewed the prenatal and intrapartum course. The delivery was at 38 gestational weeks.  Anesthesia: epidural. Postpartum course has been normal. Baby is doing well. Baby is feeding by bottle. Bleeding has stopped. Bowel function is normal. Bladder function is normal. Patient is not sexually active. Contraception method is Depo-Provera injections. Postpartum depression screening: negative.     Edinburgh Postnatal Depression Scale - 08/07/23 1033       Edinburgh Postnatal Depression Scale:  In the Past 7 Days   I have been able to laugh and see the funny side of things. 0    I have looked forward with enjoyment to things. 0    I have blamed myself unnecessarily when things went wrong. 0    I have been anxious or worried for no good reason. 1    I have felt scared or panicky for no good reason. 0    Things have been getting on top of me. 1    I have been so unhappy that I have had difficulty sleeping. 0    I have felt sad or miserable. 1    I have been so unhappy that I have been crying. 0    The thought of harming myself has occurred to me. 0    Edinburgh Postnatal Depression Scale Total 3             Health Maintenance Due  Topic Date Due   HPV VACCINES (1 - 3-dose series) Never done   INFLUENZA VACCINE  Never done   COVID-19 Vaccine (1 - 2024-25 season) Never done    The following portions of the patient's history were reviewed and updated as appropriate: allergies, current medications, past family history, past medical history, past social history, past surgical history, and problem list.  Review of Systems Pertinent items noted in HPI and remainder of comprehensive ROS otherwise negative.  Objective:  BP 121/69   Pulse 65   Ht 5\' 2"  (1.575 m)   Wt 76.7 kg   LMP 08/06/2023    Breastfeeding No   BMI 30.91 kg/m    General:  alert   Breasts:  deferred  Lungs: clear to auscultation bilaterally  Heart:  regular rate and rhythm, S1, S2 normal, no murmur, click, rub or gallop  Abdomen: soft, non-tender; bowel sounds normal; no masses,  no organomegaly   Wound well approximated incision  GU exam:  normal       Assessment:   Normal postpartum exam.   Contraceptive injection  Plan:   Essential components of care per ACOG recommendations:  1.  Mood and well being: Patient with negative depression screening today. Reviewed local resources for support.  - Patient tobacco use? No.   - hx of drug use? No  2. Infant care and feeding:  -Patient currently breastmilk feeding? No.  -Social determinants of health (SDOH) reviewed in EPIC. No concerns.    3. Sexuality, contraception and birth spacing - Patient does not want a pregnancy in the next year.   - Reviewed reproductive life planning. Reviewed contraceptive methods based on pt preferences and effectiveness.  Patient desired Hormonal Injection today.   - Discussed birth spacing of 18 months  4. Sleep and fatigue -Encouraged family/partner/community support of 4 hrs of uninterrupted sleep to help with mood and  fatigue  5. Physical Recovery  - Discussed patients delivery and complications. She describes her labor as good. - Patient had a Vaginal, no problems at delivery. Patient had a 2nd degree laceration. Perineal healing reviewed. Patient expressed understanding - Patient has urinary incontinence? No. - Patient is safe to resume physical and sexual activity  6.  Health Maintenance - HM due items addressed  - None due to age - -Breast Cancer screening indicated? No.   7. Chronic Disease/Pregnancy Condition follow up: None  - PCP follow up  Ulice Dash, Student-MidWife [facility] [place], [facility]

## 2023-08-07 NOTE — Addendum Note (Signed)
 Addended by: Cornelius Moras D on: 08/07/2023 01:03 PM   Modules accepted: Orders

## 2023-08-29 ENCOUNTER — Telehealth: Payer: MEDICAID | Admitting: Physician Assistant

## 2023-08-29 DIAGNOSIS — M549 Dorsalgia, unspecified: Secondary | ICD-10-CM

## 2023-08-29 DIAGNOSIS — R519 Headache, unspecified: Secondary | ICD-10-CM

## 2023-08-29 DIAGNOSIS — N939 Abnormal uterine and vaginal bleeding, unspecified: Secondary | ICD-10-CM | POA: Diagnosis not present

## 2023-08-29 DIAGNOSIS — R1010 Upper abdominal pain, unspecified: Secondary | ICD-10-CM | POA: Diagnosis not present

## 2023-08-29 MED ORDER — METHOCARBAMOL 500 MG PO TABS: 500.0000 mg | ORAL_TABLET | Freq: Three times a day (TID) | ORAL | 0 refills | Status: DC | PRN

## 2023-08-29 NOTE — Progress Notes (Signed)
 Virtual Visit Consent   Robin Pineda, you are scheduled for a virtual visit with a [facility] provider today. Just as with appointments in the office, your consent must be obtained to participate. Your consent will be active for this visit and any virtual visit you may have with one of our providers in the next 365 days. If you have a MyChart account, a copy of this consent can be sent to you electronically.  As this is a virtual visit, video technology does not allow for your provider to perform a traditional examination. This may limit your provider's ability to fully assess your condition. If your provider identifies any concerns that need to be evaluated in person or the need to arrange testing (such as labs, EKG, etc.), we will make arrangements to do so. Although advances in technology are sophisticated, we cannot ensure that it will always work on either your end or our end. If the connection with a video visit is poor, the visit may have to be switched to a telephone visit. With either a video or telephone visit, we are not always able to ensure that we have a secure connection.  By engaging in this virtual visit, you consent to the provision of healthcare and authorize for your insurance to be billed (if applicable) for the services provided during this visit. Depending on your insurance coverage, you may receive a charge related to this service.  I need to obtain your verbal consent now. Are you willing to proceed with your visit today? Robin Pineda has provided verbal consent on 08/29/2023 for a virtual visit (video or telephone). Hyla Maillard, [place]  Date: 08/29/2023 6:31 PM   Virtual Visit via Video Note   I, Hyla Maillard, connected with  Robin Pineda  ([id number], 04/22/2001) on 08/29/23 at  6:15 PM EDT by a video-enabled telemedicine application and verified that I am speaking with the correct person using two identifiers.  Location: Patient: Virtual Visit  Location Patient: Home Provider: Virtual Visit Location Provider: Home Office   I discussed the limitations of evaluation and management by telemedicine and the availability of in person appointments. The patient expressed understanding and agreed to proceed.    History of Present Illness: Robin Pineda is a 23 y.o. who identifies as a female who was assigned female at birth, and is being seen today for multiple complaints.   Patient with history of abnormal uterine bleeding, currently on depo provera  started after end of recent pregnancy. Notes she had her last injection and was doing well but started spotting/bleeding again as of 3/31 which has continued since that time. Notes not heavy but persistent, going through one pad/tampon per day. Notes some associated lightheadedness but denies chest pain or SOB.  Does endorses 2 days of headache that she describes as generalized with mild nausea. This is associated with some upper abdominal pain that is intermittent and infrequent, but sharp in nature. Notes history of reflux but denies this sensation or symptoms of heartburn. Is hydrating well. Denies bowel changes, melena or hematochezia. Did take some ibuprofen  for the headache which helped but did not help abd symptoms.   Also noting ongoing bilateral lower back and hip pain that has been more significant over the past few days. Denies trauma or injury. Has been seeing a chiropractor for this issue but does not feel it is helping. States she has been told she has a lot of "knotting" in her lower back.  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   Migraine without aura and without status migrainosus, not intractable 03/04/2014   Episodic tension-type headache 03/04/2014   Obesity 03/04/2014   Disequilibrium 03/04/2014    Allergies:  Allergies  Allergen Reactions   Cinnamon Hives   Cinnamon Swelling   Medications:  Current Outpatient Medications:    methocarbamol  (ROBAXIN ) 500 MG tablet, Take 1 tablet (500 mg total) by mouth every 8 (eight) hours as needed for muscle spasms., Disp: 15 tablet, Rfl: 0   ABILIFY 5 MG tablet, , Disp: , Rfl:    medroxyPROGESTERone  (DEPO-PROVERA ) 150 MG/ML injection, Inject 150 mg into the muscle every 3 (three) months., Disp: , Rfl:    Prenat-FeCbn-FeAsp-Meth-FA-DHA (PRENATE MINI ) 18-0.6-0.4-350 MG CAPS, Take 1 tablet by mouth daily., Disp: 90 capsule, Rfl: 4  Observations/Objective: Patient is well-developed, well-nourished in no acute distress.  Resting comfortably at home.  Head is normocephalic, atraumatic.  No labored breathing. Speech is clear and coherent with logical content.  Patient is alert and oriented at baseline.   Assessment and Plan: 1. Subacute back pain (Primary) - methocarbamol  (ROBAXIN ) 500 MG tablet; Take 1 tablet (500 mg total) by mouth every 8 (eight) hours as needed for muscle spasms.  Dispense: 15 tablet; Refill: 0  Ongoing issue. No improvement with chiropractic evaluation and care. No alarm signs/symptoms but acute flare. She is not breastfeeding and no concerns of pregnancy. Supportive measures and OTC medications reviewed. Will add on trial of Methocarbamol . She is to follow-up with PCP for further assessment including imaging, referral.  2. Abnormal uterine bleeding (AUB)  Breakthrough symptoms despite depo-provera . Almost 30 days of bleeding. Lighter flow but some signs concerning for anemia -- headache, fatigue, lightheadedness. She is to hydrate and  rest. Needs in person evaluation with GYN, PCP or in-person UC tomorrow if regular providers are unavailable. ER precautions reviewed.   3. Acute nonintractable headache, unspecified headache type  Possibly due to anemia from #2. Supportive measures and OTC medications reviewed. Increase hydration and food intake. In-person follow-up as discussed.  4. Upper abdominal pain  Denies symptoms of heartburn. Mild nausea and symptoms worsened after OTC NSAID. Likely mild gastritis. Dietary recommendations reviewed. Avoid NSAID and Alcohol. Tylenol  OTC as needed. Follow-up in person tomorrow for examination giving other issues. ER precautions reviewed.    Follow Up Instructions: I discussed the assessment and treatment plan with the patient. The patient was provided an opportunity to ask questions and all were answered. The patient agreed with the plan and demonstrated an understanding of the instructions.  A copy of instructions were sent to the patient via MyChart unless otherwise noted below.   The patient was advised to call back or seek an in-person evaluation if the symptoms worsen or if the condition fails to improve as anticipated.    Hyla Maillard, PA-C

## 2023-08-29 NOTE — Patient Instructions (Signed)
 Robin Robin, thank you for joining Robin Maillard, PA-C for today's virtual visit.  While this provider is not your primary care provider (PCP), if your PCP is located in our provider database this encounter information will be shared with them immediately following your visit.   A [place] MyChart account gives you access to today's visit and all your visits, tests, and labs performed at [facility] " click here if you don't have a  MyChart account or go to [email or web address]  Consent: (Patient) Robin Robin provided verbal consent for this virtual visit at the beginning of the encounter.  Current Medications:  Current Outpatient Medications:    methocarbamol  (ROBAXIN ) 500 MG tablet, Take 1 tablet (500 mg total) by mouth every 8 (eight) hours as needed for muscle spasms., Disp: 15 tablet, Rfl: 0   ABILIFY 5 MG tablet, , Disp: , Rfl:    medroxyPROGESTERone  (DEPO-PROVERA ) 150 MG/ML injection, Inject 150 mg into the muscle every 3 (three) months., Disp: , Rfl:    Prenat-FeCbn-FeAsp-Meth-FA-DHA (PRENATE MINI ) 18-0.6-0.4-350 MG CAPS, Take 1 tablet by mouth daily., Disp: 90 capsule, Rfl: 4   Medications ordered in this encounter:  Meds ordered this encounter  Medications   methocarbamol  (ROBAXIN ) 500 MG tablet    Sig: Take 1 tablet (500 mg total) by mouth every 8 (eight) hours as needed for muscle spasms.    Dispense:  15 tablet    Refill:  0    Supervising Provider:   LAMPTEY, PHILIP O [[id number]]     *If you need refills on other medications prior to your next appointment, please contact your pharmacy*  Follow-Up: Call back or seek an in-person evaluation if the symptoms worsen or if the condition fails to improve as anticipated.  [facility] [phone]  Other Instructions 1. Subacute back pain (Primary) - methocarbamol  (ROBAXIN ) 500 MG tablet; Take 1 tablet (500 mg total) by mouth every 8 (eight) hours as needed for  muscle spasms.  Dispense: 15 tablet; Refill: 0  Please avoid heavy lifting and overexertion. Ok to use OTC tylenol  and use of heating pad. Will add on trial of Methocarbamol . Do not use more than as directed. May make you drowsy so do not drive while taking medication. You need  to follow-up with PCP for further assessment including imaging, referral.  2. Abnormal uterine bleeding (AUB)  You need to hydrate and rest. SI want you to have an in person evaluation with GYN, PCP or in-person UC tomorrow if regular providers are unavailable. ER for any acutely worsening symptoms.  3. Acute nonintractable headache, unspecified headache type  Possibly due to anemia. Keep hydrated and rest. Consider OTC iron supplement. You can use Tylenol  OTC as directed. In-person follow-up as discussed.  4. Upper abdominal pain  Follow dietary recommendations below. Avoid NSAID and Alcohol. Tylenol  OTC as needed. Follow-up in person tomorrow for examination giving other issues. ER precautions reviewed.   GERD in Adults: Diet Changes When you have gastroesophageal reflux disease (GERD), you may need to make changes to your diet. Choosing the right foods can help with your symptoms. Think about working with an expert in healthy eating called a dietitian. They can help you make healthy food choices. What are tips for following this plan? Reading food labels Look for foods that are low in saturated fat. Foods that may help with your symptoms include: Foods with less than 5% of daily value (DV) of fat. Foods with 0 grams of trans fat.  Cooking [employer] in ways that don't use a lot of fat. These ways include: Baking. Steaming. Grilling. Broiling. To add flavor, try to use herbs that are low in spice and acidity. Avoid frying your food. Meal planning  Eat small meals often rather than eating 3 large meals each day. Eat your meals slowly in a place where you feel relaxed. If told by your health care  provider, avoid: Foods that cause symptoms. Keep a food diary to keep track of foods that cause symptoms. Alcohol. Drinking a lot of liquid with meals. General instructions For 2-3 hours after you eat, avoid: Bending over. Exercise. Lying down. Chew sugar-free gum after meals. What foods should I eat? Eat a healthy diet. Try to include: Foods with high amounts of fiber. These include: Fruits and vegetables. Whole grains and beans. Low-fat dairy products. Lean meats, fish, and poultry. Egg whites. Foods that cause symptoms in someone else may not cause symptoms for you. Work with your provider to find foods that are safe for you. The items listed above may not be all the foods and drinks you can have. Talk with a dietitian to learn more. The items listed above may not be a complete list of foods and beverages you can eat and drink. Contact a dietitian for more information. What foods should I avoid? Limiting some of these foods may help with your symptoms. Each person is different. Talk with a dietitian or your provider to help you find the exact foods to avoid. Some of the foods to avoid may include: Fruits Fruits with a lot of acid in them. These may include citrus fruits, such as oranges, grapefruit, pineapple, and lemons. Vegetables Deep-fried vegetables, such as [place] fries. Vegetables, sauces, or toppings made with added fat and vegetables with acid in them. These may include tomatoes and tomato products, chili peppers, onions, garlic, and horseradish. Grains Pastries or quick breads with added fat. Meats and other proteins High-fat meats, such as fatty beef or pork, hot dogs, ribs, ham, sausage, salami, and bacon. Fried meat or protein, such as fried fish and fried chicken. Egg yolks. Fats and oils Butter. Margarine. Shortening. Ghee. Drinks Coffee and other drinks with caffeine  in them. Fizzy and sugary drinks, such as soda and energy drinks. Fruit juice made with acidic  fruits, such as orange or grapefruit. Tomato juice. Sweets and desserts Chocolate and cocoa. Donuts. Seasonings and condiments Mint, such as peppermint and spearmint. Condiments, herbs, or seasonings that cause symptoms. These may include curry, hot sauce, or vinegar-based salad dressings. The items listed above may not be all the foods and drinks you should avoid. Talk with a dietitian to learn more. Questions to ask your health care provider Changes to your diet and everyday life are often the first steps taken to manage symptoms of GERD. If these changes don't help, talk with your provider about taking medicines. Where to find more information International [employer] for Gastrointestinal [employer]: aboutgerd.org This information is not intended to replace advice given to you by your health care provider. Make sure you discuss any questions you have with your health care provider. Document Revised: 03/07/2023 Document Reviewed: 09/21/2022 Elsevier Patient Education  2024 Elsevier Inc.   If you have been instructed to have an in-person evaluation today at a local Urgent Care facility, please use the link below. It will take you to a list of all of our available [facility], including address, phone number and hours of operation. Please do not  delay care.  [facility]  If you or a family member do not have a primary care provider, use the link below to schedule a visit and establish care. When you choose a [facility] primary care physician or advanced practice provider, you gain a long-term partner in health. Find a Primary Care Provider  Learn more about [place] [facility] [place]'s in-office and virtual care options: [place] - Get Care Now

## 2023-08-31 ENCOUNTER — Ambulatory Visit (INDEPENDENT_AMBULATORY_CARE_PROVIDER_SITE_OTHER): Payer: MEDICAID | Admitting: Obstetrics

## 2023-08-31 ENCOUNTER — Encounter: Payer: Self-pay | Admitting: Obstetrics

## 2023-08-31 ENCOUNTER — Ambulatory Visit: Payer: MEDICAID | Admitting: Obstetrics

## 2023-08-31 VITALS — BP 115/70 | HR 88 | Wt 171.0 lb

## 2023-08-31 DIAGNOSIS — Z3009 Encounter for other general counseling and advice on contraception: Secondary | ICD-10-CM | POA: Insufficient documentation

## 2023-08-31 MED ORDER — NORELGESTROMIN-ETH ESTRADIOL 150-35 MCG/24HR TD PTWK: 1.0000 | MEDICATED_PATCH | TRANSDERMAL | 3 refills | Status: AC

## 2023-08-31 NOTE — Progress Notes (Signed)
 Subjective:    ADAMARIZ GILLOTT is a 23 y.o. female who presents for contraception counseling. She received a Depo shot at her 6 week PP visit 08/07/23  and has had spotting and light bleeding daily since 08/09/23. She also reports frequent headaches, shakiness, and stomachaches that she attributes to the Depo. She would like to discuss an alternate form of contraception.  Menstrual History: OB History     Gravida  1   Para  1   Term  1   Preterm      AB      Living  1      SAB      IAB      Ectopic      Multiple  0   Live Births  1           Patient's last menstrual period was 08/09/2023.    The following portions of the patient's history were reviewed and updated as appropriate: allergies, current medications, and problem list.  Review of Systems Pertinent items are noted in HPI.   Objective:    No exam performed today,  not medically indicated .   Assessment:    23 y.o., starting  Xulane patches , no contraindications.   Plan:    Discussed that she only recently received the Depo, and bleeding and symptoms typically improve after the second shot. Skii is not interested in continuing this method. We discussed the available options, and she would like to start the Xulane patch. Instructions on use, risks, and potential side effects given. Rx sent - will start the patch around the time her next Depo shot would be due.   RTC PRN or for annual.  Babak Lucus M Salathiel Ferrara, CNM

## 2023-09-26 ENCOUNTER — Telehealth: Payer: MEDICAID | Admitting: Physician Assistant

## 2023-09-26 DIAGNOSIS — J069 Acute upper respiratory infection, unspecified: Secondary | ICD-10-CM | POA: Diagnosis not present

## 2023-09-26 MED ORDER — ALBUTEROL SULFATE HFA 108 (90 BASE) MCG/ACT IN AERS: 2.0000 | INHALATION_SPRAY | Freq: Four times a day (QID) | RESPIRATORY_TRACT | 0 refills | Status: AC | PRN

## 2023-09-26 MED ORDER — BENZONATATE 100 MG PO CAPS: 100.0000 mg | ORAL_CAPSULE | Freq: Three times a day (TID) | ORAL | 0 refills | Status: DC | PRN

## 2023-09-26 NOTE — Progress Notes (Signed)
 Virtual Visit Consent   Robin Pineda, you are scheduled for a virtual visit with a [facility] provider today. Just as with appointments in the office, your consent must be obtained to participate. Your consent will be active for this visit and any virtual visit you may have with one of our providers in the next 365 days. If you have a MyChart account, a copy of this consent can be sent to you electronically.  As this is a virtual visit, video technology does not allow for your provider to perform a traditional examination. This may limit your provider's ability to fully assess your condition. If your provider identifies any concerns that need to be evaluated in person or the need to arrange testing (such as labs, EKG, etc.), we will make arrangements to do so. Although advances in technology are sophisticated, we cannot ensure that it will always work on either your end or our end. If the connection with a video visit is poor, the visit may have to be switched to a telephone visit. With either a video or telephone visit, we are not always able to ensure that we have a secure connection.  By engaging in this virtual visit, you consent to the provision of healthcare and authorize for your insurance to be billed (if applicable) for the services provided during this visit. Depending on your insurance coverage, you may receive a charge related to this service.  I need to obtain your verbal consent now. Are you willing to proceed with your visit today? Robin Pineda has provided verbal consent on 09/26/2023 for a virtual visit (video or telephone). Hyla Maillard, [place]  Date: 09/26/2023 2:46 PM   Virtual Visit via Video Note   I, Hyla Maillard, connected with  Robin Pineda  ([id number], 21/07/2000) on 09/26/23 at  2:30 PM EDT by a video-enabled telemedicine application and verified that I am speaking with the correct person using two identifiers.  Location: Patient: Virtual Visit  Location Patient: Home Provider: Virtual Visit Location Provider: Home Office   I discussed the limitations of evaluation and management by telemedicine and the availability of in person appointments. The patient expressed understanding and agreed to proceed.    History of Present Illness: Robin Pineda is a 23 y.o. who identifies as a female who was assigned female at birth, and is being seen today for 1.5 days of nasal congestion, sinus congestion and sore throat. Denies fever, chest congestion or substantial cough. Notes some mild windedness with exertion. Denies known exposure to COVID.  Notes her infant with similar symptoms starting yesterday. Took to pediatrician.  OTC -- Theraflu  Is not currently breastfeeding.   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   Migraine without aura and without status migrainosus, not intractable 03/04/2014   Episodic tension-type headache 03/04/2014   Obesity 03/04/2014   Disequilibrium 03/04/2014    Allergies:  Allergies  Allergen Reactions   Cinnamon Hives   Cinnamon Swelling   Medications:  Current Outpatient Medications:    albuterol  (VENTOLIN  HFA) 108 (90 Base) MCG/ACT inhaler, Inhale 2 puffs into the lungs every 6 (six) hours as needed for wheezing or shortness of breath., Disp: 8 g, Rfl: 0   benzonatate  (TESSALON ) 100 MG capsule, Take 1 capsule (100 mg total) by  mouth 3 (three) times daily as needed for cough., Disp: 30 capsule, Rfl: 0   ABILIFY 5 MG tablet, 10 mg., Disp: , Rfl:    norelgestromin -ethinyl estradiol  (XULANE) 150-35 MCG/24HR transdermal patch, Place 1 patch onto the skin once a week., Disp: 9 patch, Rfl: 3  Observations/Objective: Patient is well-developed, well-nourished in no acute distress.   Resting comfortably at home.  Head is normocephalic, atraumatic.  No labored breathing. Speech is clear and coherent with logical content.  Patient is alert and oriented at baseline.   Assessment and Plan: 1. Viral URI (Primary) - albuterol  (VENTOLIN  HFA) 108 (90 Base) MCG/ACT inhaler; Inhale 2 puffs into the lungs every 6 (six) hours as needed for wheezing or shortness of breath.  Dispense: 8 g; Refill: 0 - benzonatate  (TESSALON ) 100 MG capsule; Take 1 capsule (100 mg total) by mouth 3 (three) times daily as needed for cough.  Dispense: 30 capsule; Refill: 0  Will have her COVID test as a precaution. She is to message us  if positive. She is to keep hydrated and rest. Ok to continue her Theraflu.  Will add on albuterol  to help relax airway and calm bronchospasm. Will also add-on a prescription cough medication to use with this.  Follow Up Instructions: I discussed the assessment and treatment plan with the patient. The patient was provided an opportunity to ask questions and all were answered. The patient agreed with the plan and demonstrated an understanding of the instructions.  A copy of instructions were sent to the patient via MyChart unless otherwise noted below.   The patient was advised to call back or seek an in-person evaluation if the symptoms worsen or if the condition fails to improve as anticipated.    Hyla Maillard, PA-C

## 2023-09-26 NOTE — Patient Instructions (Signed)
  Lonzell Robin, thank you for joining Hyla Maillard, PA-C for today's virtual visit.  While this provider is not your primary care provider (PCP), if your PCP is located in our provider database this encounter information will be shared with them immediately following your visit.   A [facility] MyChart account gives you access to today's visit and all your visits, tests, and labs performed at [facility] " click here if you don't have a [facility] MyChart account or go to [email or web address]  Consent: (Patient) Lonzell Robin provided verbal consent for this virtual visit at the beginning of the encounter.  Current Medications:  Current Outpatient Medications:    albuterol  (VENTOLIN  HFA) 108 (90 Base) MCG/ACT inhaler, Inhale 2 puffs into the lungs every 6 (six) hours as needed for wheezing or shortness of breath., Disp: 8 g, Rfl: 0   benzonatate  (TESSALON ) 100 MG capsule, Take 1 capsule (100 mg total) by mouth 3 (three) times daily as needed for cough., Disp: 30 capsule, Rfl: 0   ABILIFY 5 MG tablet, 10 mg., Disp: , Rfl:    norelgestromin -ethinyl estradiol  (XULANE) 150-35 MCG/24HR transdermal patch, Place 1 patch onto the skin once a week., Disp: 9 patch, Rfl: 3   Medications ordered in this encounter:  Meds ordered this encounter  Medications   albuterol  (VENTOLIN  HFA) 108 (90 Base) MCG/ACT inhaler    Sig: Inhale 2 puffs into the lungs every 6 (six) hours as needed for wheezing or shortness of breath.    Dispense:  8 g    Refill:  0    Supervising Provider:   Corine Dice [[id number]]   benzonatate  (TESSALON ) 100 MG capsule    Sig: Take 1 capsule (100 mg total) by mouth 3 (three) times daily as needed for cough.    Dispense:  30 capsule    Refill:  0    Supervising Provider:   Corine Dice [[id number]]     *If you need refills on other medications prior to your next appointment, please contact your pharmacy*  Follow-Up: Call back or seek an  in-person evaluation if the symptoms worsen or if the condition fails to improve as anticipated.  [facility] [phone]  Other Instructions Please test for COVID and message with your results. Continue to hydrate and rest.  Ok to continue your Theraflu over-the-counter. I have sent in a cough medication and inhaler to use as directed. Follow-up in person for any non-resolving, new or worsening symptoms despite treatment.    If you have been instructed to have an in-person evaluation today at a local Urgent Care facility, please use the link below. It will take you to a list of all of our available [facility], including address, phone number and hours of operation. Please do not delay care.  [facility]  If you or a family member do not have a primary care provider, use the link below to schedule a visit and establish care. When you choose a [facility] primary care physician or advanced practice provider, you gain a long-term partner in health. Find a Primary Care Provider  Learn more about [facility]'s in-office and virtual care options: [place] - Get Care Now

## 2023-10-09 ENCOUNTER — Encounter: Payer: Self-pay | Admitting: Emergency Medicine

## 2023-10-09 ENCOUNTER — Ambulatory Visit
Admission: EM | Admit: 2023-10-09 | Discharge: 2023-10-09 | Disposition: A | Discharge status: Home / Self Care | Payer: MEDICAID

## 2023-10-09 DIAGNOSIS — R590 Localized enlarged lymph nodes: Secondary | ICD-10-CM | POA: Diagnosis not present

## 2023-10-09 NOTE — ED Triage Notes (Signed)
 Patient complains of feeling like something is stuck in her throat x 3 days. Has not take anything and denies pain.

## 2023-10-09 NOTE — Discharge Instructions (Addendum)
 Your evaluated for this sensation of foreign body within the throat, based on recent upper respiratory infection and placement I do believe that this is related to a lymph node which can become swollen during times of illness as it is a part of our drainage system  On exam there is no obstruction within the throat or abnormality seen on exam  You may attempt use of ibuprofen  which helps to reduce inflammation  May help warm compresses to the affected area as well as massage  If your symptoms continue to persist please follow-up with your primary doctor or you may follow-up with ear nose and throat who is the specialist  At any point if symptoms worsen in severity and you begin to have difficulty breathing or swallowing please go to the nearest emergency department for immediate evaluation

## 2023-10-09 NOTE — ED Provider Notes (Signed)
 Robin Pineda    CSN: [id number] Arrival date & time: 10/09/23  1243      History   Chief Complaint Chief Complaint  Patient presents with   Sore Throat    HPI Robin Pineda is a 23 y.o. female.   Presents for evaluation of a sensation of foreign body to the back of the throat present for 3 days.  Has been constant.  Denies pain to the throat or pain with swallowing, difficulty swallowing.  Started abruptly.  Denies new fever but recent upper respiratory infection within the last 2 weeks, cough still present.  Has not attempted treatment.  Past Medical History:  Diagnosis Date   Anemia affecting pregnancy 05/17/2023   28 wks: Hgb 10.3     Anxiety    Arm fracture, left    Arm fracture, right    Asthma    Broken nose    Depression    Encounter for care or examination of lactating mother 07/02/2023   Enlarged tonsils    Headache    Postpartum care following vaginal delivery 07/02/2023   POTS (postural orthostatic tachycardia syndrome)    Rh negative state in antepartum period 02/24/2023   Rhogam 04/18/23     Seasonal allergies    Supervision of normal pregnancy 11/25/2022              Clinical Staff    Provider      Office Location     [facility]    Dating     07/11/2023, LMP      Language     English    Anatomy US      normal      Flu Vaccine     declined    Genetic Screen     NIPS: low risk      TDaP vaccine     04/18/23    Hgb A1C or   GTT    Early :  Third trimester :       Covid    declined         LAB RESULTS       Rhogam     AB/Negative/-- (08/26 1157)     B   UTI (urinary tract infection)     Patient Active Problem List   Diagnosis Date Noted   General counseling and advice on contraceptive management 08/31/2023   Bipolar disorder (HCC) 01/02/2023   MDD (major depressive disorder), recurrent, severe, 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   Migraine without aura and without status migrainosus, not intractable 03/04/2014   Episodic tension-type headache  03/04/2014   Obesity 03/04/2014   Disequilibrium 03/04/2014    Past Surgical History:  Procedure Laterality Date   OTHER SURGICAL HISTORY Right 2011   Pins placed to repair break in right arm   TONSILECTOMY, ADENOIDECTOMY, BILATERAL MYRINGOTOMY AND TUBES     TONSILLECTOMY      OB History     Gravida  1   Para  1   Term  1   Preterm      AB      Living  1      SAB      IAB      Ectopic      Multiple  0   Live Births  1            Home Medications    Prior to Admission medications   Medication Sig Start Date End Date Taking? Authorizing Provider  ABILIFY 5  MG tablet 10 mg.    [provider]  albuterol  (VENTOLIN  HFA) 108 (90 Base) MCG/ACT inhaler Inhale 2 puffs into the lungs every 6 (six) hours as needed for wheezing or shortness of breath. 09/26/23   Farris Hong, PA-C  benzonatate  (TESSALON ) 100 MG capsule Take 1 capsule (100 mg total) by mouth 3 (three) times daily as needed for cough. 09/26/23   Farris Hong, PA-C  norelgestromin -ethinyl estradiol  (XULANE) 150-35 MCG/24HR transdermal patch Place 1 patch onto the skin once a week. 08/31/23   Phylliss Brenner, CNM  FLUoxetine  (PROZAC ) 20 MG capsule Take 1 capsule (20 mg total) by mouth daily. 11/14/17 02/18/19  Thomas, Lashunda, NP  fluticasone  (FLONASE ) 50 MCG/ACT nasal spray Place 1 spray into both nostrils daily.  12/29/19  [provider]    Family History Family History  Problem Relation Age of Onset   Lupus Mother    Heart failure Father    Diabetes Father    Hypertension Father    Healthy Brother    Other Brother        cardiomyopathy; heart block   Healthy Maternal Grandmother    Healthy Maternal Grandfather    Other Paternal Grandmother        cardiomyopathy   Healthy Paternal Grandfather     Social History Social History   Tobacco Use   Smoking status: Former    Current packs/day: 0.00    Types: Cigarettes    Quit date: 10/2022    Years since  quitting: 1.0   Smokeless tobacco: Never   Tobacco comments:    Trying to stop using patches  Vaping Use   Vaping status: Some Days   Substances: Nicotine , THC, Flavoring  Substance Use Topics   Alcohol use: No   Drug use: Yes    Frequency: 7.0 times per week    Types: Marijuana    Comment: daily marijuana, 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  Updated Vital Signs BP 118/76 (BP Location: Left Arm)   Pulse 80   Temp 97.9 F (36.6 C) (Oral)   Resp 18   SpO2 98%   Visual Acuity Right Eye Distance:   Left Eye Distance:   Bilateral Distance:    Right Eye Near:   Left Eye Near:    Bilateral Near:     Physical Exam Constitutional:      Appearance: Normal appearance.  HENT:     Mouth/Throat:     Pharynx: No oropharyngeal exudate or posterior  oropharyngeal erythema.     Comments: No obstruction noted Eyes:     Extraocular Movements: Extraocular movements intact.  Neck:   Musculoskeletal:     Cervical back: Normal range of motion and neck supple.  Neurological:     Mental Status: She is alert and oriented to person, place, and time. Mental status is at baseline.      UC Treatments / Results  Labs (all labs ordered are listed, but only abnormal results are displayed) Labs Reviewed - No data to display  EKG   Radiology No results found.  Procedures Procedures  (including critical care time)  Medications Ordered in UC Medications - No data to display  Initial Impression / Assessment and Plan / UC Course  I have reviewed the triage vital signs and the nursing notes.  Pertinent labs & imaging results that were available during my care of the patient were reviewed by me and considered in my medical decision making (see chart for details).  Cervical adenopathy  Vital signs stable, patient in no signs of distress nontoxic-appearing, no erythema or obstruction noted to the oropharynx, while unable to palpate the lymph nodes pain is indicated to be within the cervical chain, most likely cause of symptoms is lymph node swelling, discussed this with patient, offered prednisone , declined due to intolerance recommended oral NSAIDs, warm compresses and massage for supportive care, for persisting symptoms advised PCP follow-up also given walker referral to ear nose and throat for worsening symptoms with difficulty swallowing or breathing patient to go to the nearest emergency department, verbalized understanding Final Clinical Impressions(s) / UC Diagnoses   Final diagnoses:  Cervical adenopathy   Discharge Instructions      Your evaluated for this sensation of foreign body within the throat, based on recent upper respiratory infection and placement I do believe that this is related to a lymph node which can become swollen during times of illness as it is a part of our drainage system  On exam there is no obstruction within the throat or abnormality seen on exam  You may attempt use of ibuprofen  which helps to reduce inflammation  May help warm compresses to the affected area as well as massage  If your symptoms continue to persist please follow-up with your primary doctor or you may follow-up with ear nose and throat who is the specialist  At any point if symptoms worsen in severity and you begin to have difficulty breathing or swallowing please go to  the nearest emergency department for immediate evaluation  ED Prescriptions   None    PDMP not reviewed this encounter.   Reena Canning, NP 10/09/23 1336

## 2023-10-30 ENCOUNTER — Encounter (HOSPITAL_COMMUNITY): Payer: Self-pay

## 2023-10-30 ENCOUNTER — Other Ambulatory Visit: Payer: Self-pay

## 2023-10-30 ENCOUNTER — Ambulatory Visit: Payer: MEDICAID

## 2023-10-30 ENCOUNTER — Emergency Department (HOSPITAL_COMMUNITY)
Admission: EM | Admit: 2023-10-30 | Discharge: 2023-10-30 | Disposition: A | Discharge status: Home / Self Care | Payer: MEDICAID | Attending: Emergency Medicine | Admitting: Emergency Medicine

## 2023-10-30 DIAGNOSIS — Z7951 Long term (current) use of inhaled steroids: Secondary | ICD-10-CM | POA: Diagnosis not present

## 2023-10-30 DIAGNOSIS — R2232 Localized swelling, mass and lump, left upper limb: Secondary | ICD-10-CM | POA: Diagnosis present

## 2023-10-30 DIAGNOSIS — L02412 Cutaneous abscess of left axilla: Secondary | ICD-10-CM | POA: Diagnosis not present

## 2023-10-30 DIAGNOSIS — J45909 Unspecified asthma, uncomplicated: Secondary | ICD-10-CM | POA: Diagnosis not present

## 2023-10-30 MED ORDER — LIDOCAINE-EPINEPHRINE (PF) 2 %-1:200000 IJ SOLN
10.0000 mL | Freq: Once | INTRAMUSCULAR | Status: AC
  Administered 2023-10-30: 10 mL
  Filled 2023-10-30: qty 20

## 2023-10-30 MED ORDER — DOXYCYCLINE HYCLATE 100 MG PO CAPS: 100.0000 mg | ORAL_CAPSULE | Freq: Two times a day (BID) | ORAL | 0 refills | Status: DC

## 2023-10-30 NOTE — ED Provider Notes (Signed)
 [place] [facility] AT [facility] Provider Note   CSN: [id number] Arrival date & time: 10/30/23  9071     Patient presents with: Abscess   Robin Pineda is a 23 y.o. female.    Abscess Patient presents with swelling in the left axilla.  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  Has had for the last 3 days.  No fevers or chills.  No drainage.    Past Medical History:  Diagnosis Date   Anemia affecting pregnancy 05/17/2023   28 wks: Hgb 10.3     Anxiety    Arm fracture, left    Arm fracture, right    Asthma    Broken nose    Depression    Encounter for care or examination of lactating mother 07/02/2023   Enlarged tonsils    Headache    Postpartum care following vaginal delivery 07/02/2023   POTS (postural orthostatic tachycardia syndrome)    Rh negative state in antepartum period 02/24/2023   Rhogam 04/18/23     Seasonal allergies    Supervision of normal pregnancy 11/25/2022              Clinical Staff    Provider      Office Location     [facility]    Dating     07/11/2023, LMP      Language     English    Anatomy US      normal      Flu Vaccine     declined    Genetic Screen     NIPS: low risk      TDaP vaccine     04/18/23    Hgb A1C or   GTT    Early :  Third trimester :       Covid    declined         LAB RESULTS       Rhogam     AB/Negative/-- (08/26 1157)     B   UTI (urinary tract infection)     Prior to Admission medications   Medication Sig Start Date End Date Taking? Authorizing Provider  doxycycline  (VIBRAMYCIN ) 100 MG capsule Take 1 capsule (100 mg total) by mouth 2 (two) times daily. 10/30/23  Yes Patsey Lot, MD  ABILIFY 5 MG tablet 10 mg.    [provider]  albuterol  (VENTOLIN  HFA) 108 (90 Base) MCG/ACT inhaler Inhale 2 puffs into the lungs every 6 (six) hours as needed for wheezing or shortness of breath. 09/26/23   Gladis Elsie BROCKS, PA-C  benzonatate  (TESSALON ) 100 MG capsule Take 1 capsule (100 mg total) by mouth 3  (three) times daily as needed for cough. 09/26/23   Gladis Elsie BROCKS, PA-C  norelgestromin -ethinyl estradiol  (XULANE) 150-35 MCG/24HR transdermal patch Place 1 patch onto the skin once a week. 08/31/23   Justino Eleanor HERO, CNM  FLUoxetine  (PROZAC ) 20 MG capsule Take 1 capsule (20 mg total) by mouth daily. 11/14/17 02/18/19  Thomas, Lashunda, NP  fluticasone  (FLONASE ) 50 MCG/ACT nasal spray Place 1 spray into both nostrils daily.  12/29/19  [provider]    Allergies: Cinnamon and Cinnamon    Review of Systems  Updated Vital Signs BP 121/70 (BP Location: Right Arm)   Pulse 79   Temp 98.8 F (37.1 C)   Resp 16   LMP 07/12/2023   SpO2 100%   Physical Exam Vitals and nursing note reviewed.   Skin:    Comments: Right  axilla has a tender mass.  Slight erythema.  No definite fluctuance.  Approximately 1.5 cm long.   Neurological:     Mental Status: She is alert.     (all labs ordered are listed, but only abnormal results are displayed) Labs Reviewed - No data to display  EKG: None  Radiology: No results found.   .Incision and Drainage  Date/Time: 10/30/2023 10:20 AM  Performed by: Patsey Lot, MD Authorized by: Patsey Lot, MD   Consent:    Consent obtained:  Verbal   Consent given by:  Patient   Risks discussed:  Damage to other organs, incomplete drainage, infection and pain   Alternatives discussed:  No treatment Universal protocol:    Patient identity confirmed:  Verbally with patient Location:    Type:  Abscess   Size:  1.5   Location: Left axila. Pre-procedure details:    Skin preparation:  Povidone-iodine Sedation:    Sedation type:  None Anesthesia:    Anesthesia method:  Local infiltration   Local anesthetic:  Lidocaine  2% WITH epi Procedure type:    Complexity:  Simple Procedure details:    Ultrasound guidance: yes     Needle aspiration: no     Incision types:  Single straight   Incision depth:  Subcutaneous   Wound  management:  Probed and deloculated   Drainage:  Purulent   Drainage amount:  Scant   Wound treatment:  Wound left open   Packing materials:  None .Ultrasound ED Soft Tissue  Date/Time: 10/30/2023 10:22 AM  Performed by: Patsey Lot, MD Authorized by: Patsey Lot, MD   Procedure details:    Indications: localization of abscess     Longitudinal view:  Visualized   Images: archived   Location:    Location: axilla     Side:  Left Findings:     abscess present    Medications Ordered in the ED  lidocaine -EPINEPHrine  (XYLOCAINE  W/EPI) 2 %-1:200000 (PF) injection 10 mL (10 mLs Infiltration Given by Other 10/30/23 1011)                                    Medical Decision Making  Patient with abscess versus cellulitis left axilla.  Ultrasound done and did show fluid collection.  Incision and drainage done with entry into the abscess but minimal purulence coming out.  Will give antibiotics.         Final diagnoses:  Abscess of left axilla    ED Discharge Orders          Ordered    doxycycline  (VIBRAMYCIN ) 100 MG capsule  2 times daily        10/30/23 1023               Patsey Lot, MD 10/30/23 1023

## 2023-10-30 NOTE — ED Triage Notes (Signed)
 Pt c.o left axillary abscess x 3 days.

## 2023-11-21 ENCOUNTER — Telehealth: Payer: MEDICAID | Admitting: Family Medicine

## 2023-11-21 DIAGNOSIS — B379 Candidiasis, unspecified: Secondary | ICD-10-CM

## 2023-11-21 MED ORDER — FLUCONAZOLE 150 MG PO TABS: 150.0000 mg | ORAL_TABLET | Freq: Once | ORAL | 0 refills | Status: AC

## 2023-11-21 NOTE — Patient Instructions (Addendum)
 Robin Pineda, thank you for joining Olam DELENA Darby, FNP for today's virtual visit.  While this provider is not your primary care provider (PCP), if your PCP is located in our provider database this encounter information will be shared with them immediately following your visit.   A [facility] MyChart account gives you access to today's visit and all your visits, tests, and labs performed at [facility]  click here if you don't have a [facility] MyChart account or go to [email or web address]  Consent: (Patient) Robin Pineda provided verbal consent for this virtual visit at the beginning of the encounter.  Current Medications:  Current Outpatient Medications:    fluconazole  (DIFLUCAN ) 150 MG tablet, Take 1 tablet (150 mg total) by mouth once for 1 dose. Take the second tablet after 3 days if symptoms are improving but not fully resolved., Disp: 2 tablet, Rfl: 0   ABILIFY 5 MG tablet, 10 mg., Disp: , Rfl:    albuterol  (VENTOLIN  HFA) 108 (90 Base) MCG/ACT inhaler, Inhale 2 puffs into the lungs every 6 (six) hours as needed for wheezing or shortness of breath., Disp: 8 g, Rfl: 0   benzonatate  (TESSALON ) 100 MG capsule, Take 1 capsule (100 mg total) by mouth 3 (three) times daily as needed for cough., Disp: 30 capsule, Rfl: 0   doxycycline  (VIBRAMYCIN ) 100 MG capsule, Take 1 capsule (100 mg total) by mouth 2 (two) times daily., Disp: 14 capsule, Rfl: 0   norelgestromin -ethinyl estradiol  (XULANE) 150-35 MCG/24HR transdermal patch, Place 1 patch onto the skin once a week., Disp: 9 patch, Rfl: 3   Medications ordered in this encounter:  Meds ordered this encounter  Medications   fluconazole  (DIFLUCAN ) 150 MG tablet    Sig: Take 1 tablet (150 mg total) by mouth once for 1 dose. Take the second tablet after 3 days if symptoms are improving but not fully resolved.    Dispense:  2 tablet    Refill:  0    Supervising Provider:   LAMPTEY, PHILIP O [[id number]]     *If you need  refills on other medications prior to your next appointment, please contact your pharmacy*  Follow-Up: Call back or seek an in-person evaluation if the symptoms worsen or if the condition fails to improve as anticipated.  [facility] [phone]  Other Instructions Suspected yeast infection following antibiotic course. Take 1 fluconazole  today. Discussed symptoms should improve over the next 3 days.  If symptoms have improved but have not fully resolved please take the second tablet. If symptoms are not resolving or are worsening I would recommend in person evaluation for physical exam as well as to undergo testing.  As we discussed there are multiple types of vaginal infections (fungal, bacterial, sexually transmitted infections) that can be very difficult to differentiate from each other based on symptoms alone so sometimes testing is necessary to determine the cause.  If you have been instructed to have an in-person evaluation today at a local Urgent Care facility, please use the link below. It will take you to a list of all of our available [facility], including address, phone number and hours of operation. Please do not delay care.  [facility]  If you or a family member do not have a primary care provider, use the link below to schedule a visit and establish care. When you choose a [facility] primary care physician or advanced practice provider, you gain a long-term partner in health.  Find a Primary Care Provider  Learn more about [facility]'s in-office and virtual care options: [facility] Get [facility] Now

## 2023-11-21 NOTE — Progress Notes (Signed)
 Virtual Visit Consent   Robin Pineda, you are scheduled for a virtual visit with a [facility] provider today. Just as with appointments in the office, your consent must be obtained to participate. Your consent will be active for this visit and any virtual visit you may have with one of our providers in the next 365 days. If you have a MyChart account, a copy of this consent can be sent to you electronically.  As this is a virtual visit, video technology does not allow for your provider to perform a traditional examination. This may limit your provider's ability to fully assess your condition. If your provider identifies any concerns that need to be evaluated in person or the need to arrange testing (such as labs, EKG, etc.), we will make arrangements to do so. Although advances in technology are sophisticated, we cannot ensure that it will always work on either your end or our end. If the connection with a video visit is poor, the visit may have to be switched to a telephone visit. With either a video or telephone visit, we are not always able to ensure that we have a secure connection.  By engaging in this virtual visit, you consent to the provision of healthcare and authorize for your insurance to be billed (if applicable) for the services provided during this visit. Depending on your insurance coverage, you may receive a charge related to this service.  I need to obtain your verbal consent now. Are you willing to proceed with your visit today? Robin Pineda has provided verbal consent on 11/21/2023 for a virtual visit (video or telephone). Olam DELENA Darby, FNP  Date: 11/21/2023 10:55 AM   Virtual Visit via Video Note   I, Olam DELENA Darby, connected with  Robin Pineda  ([id number], [birth year]) on 11/21/23 at 10:45 AM EDT by a video-enabled telemedicine application and verified that I am speaking with the correct person using two identifiers.  Location: Patient: Virtual Visit Location Patient:  Mobile Provider: Virtual Visit Location Provider: Home Office   I discussed the limitations of evaluation and management by telemedicine and the availability of in person appointments. The patient expressed understanding and agreed to proceed.    History of Present Illness: Robin Pineda is a 23 y.o. who identifies as a female who was assigned female at birth, and is being seen today for abdominal pain x 2 days, along with changes in vaginal discharge and + nausea. She denies vomiting or diarrhea. Watery discharge 2-3 days white in color and slight odor.  Just completed the course of doxycycline  for abscess prescribed with last dose 3 days ago.  Feels similar to a yeast infection she has had in the past.   No pain with urination. No incontinence. No fevers or chills. Chronic low back pain, not new in the last few days.  LMP last week.  She is not breast-feeding. This SmartLink has not been configured with any valid records.    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 Take the second tablet after 3 days if symptoms are improving but not fully resolved., Disp: 2 tablet, Rfl: 0   ABILIFY 5 MG tablet, 10 mg., Disp: , Rfl:    albuterol  (VENTOLIN  HFA) 108 (90 Base) MCG/ACT inhaler, Inhale 2 puffs into the lungs every 6 (six) hours as needed for wheezing or shortness of  breath., Disp: 8 g, Rfl: 0   benzonatate  (TESSALON ) 100 MG capsule, Take 1 capsule (100 mg total) by mouth 3 (three) times daily as needed for cough., Disp: 30 capsule, Rfl: 0   doxycycline  (VIBRAMYCIN ) 100 MG capsule, Take 1 capsule (100 mg total) by mouth 2 (two) times daily., Disp: 14 capsule, Rfl: 0   norelgestromin -ethinyl estradiol  (XULANE) 150-35 MCG/24HR transdermal patch, Place 1 patch onto the skin once a week., Disp: 9 patch, Rfl: 3  Observations/Objective: Patient is well-developed, well-nourished in no acute distress.  Resting comfortably.  Head is normocephalic, atraumatic.  No labored breathing. Speech is clear and coherent with logical content.  Patient is alert and oriented at baseline.   Assessment and Plan: 1. Yeast infection (Primary) - fluconazole  (DIFLUCAN ) 150 MG tablet; Take 1 tablet (150 mg total) by mouth once for 1 dose. Take the second tablet after 3 days if symptoms are improving but not fully resolved.  Dispense: 2 tablet; Refill: 0 Suspected yeast infection following antibiotic course. Take 1 fluconazole  today. Discussed symptoms should improve over the next 3 days.  If symptoms have improved but have not fully resolved please take the second tablet. If symptoms are not resolving or are worsening would recommend in person evaluation for physical exam as well as to undergo testing.  As we discussed there are multiple types of vaginal infections (fungal, bacterial, sexually transmitted infections) that can be very difficult to differentiate from each other based on symptoms alone so sometimes testing is necessary to determine the cause.  Follow Up Instructions: I discussed the assessment and treatment plan with the patient. The patient was provided an opportunity to ask questions and all were answered. The patient agreed with the plan and demonstrated an understanding of the instructions.  A copy of instructions were sent to the patient via MyChart unless otherwise  noted below.   The patient was advised to call back or seek an in-person evaluation if the symptoms worsen or if the condition fails to improve as anticipated.    Olam DELENA Darby, FNP

## 2023-12-04 ENCOUNTER — Ambulatory Visit
Admission: EM | Admit: 2023-12-04 | Discharge: 2023-12-04 | Disposition: A | Discharge status: Home / Self Care | Payer: MEDICAID | Attending: Emergency Medicine | Admitting: Emergency Medicine

## 2023-12-04 ENCOUNTER — Encounter: Payer: Self-pay | Admitting: Emergency Medicine

## 2023-12-04 DIAGNOSIS — R102 Pelvic and perineal pain: Secondary | ICD-10-CM | POA: Insufficient documentation

## 2023-12-04 LAB — POCT URINE PREGNANCY: Preg Test, Ur: NEGATIVE

## 2023-12-04 MED ORDER — METRONIDAZOLE 500 MG PO TABS: 500.0000 mg | ORAL_TABLET | Freq: Two times a day (BID) | ORAL | 0 refills | Status: DC

## 2023-12-04 NOTE — Discharge Instructions (Signed)
 Today you are being treated prophylactically for  Bacterial vaginosis   Take Metronidazole  500 mg twice a day for 7 days, do not drink alcohol while using medication, this will make you feel sick   Bacterial vaginosis which results from an overgrowth of one on several organisms that are normally present in your vagina. Vaginosis is an inflammation of the vagina that can result in discharge, itching and pain.  Vaginal swab checking for yeast and bacterial vaginosis pending 2 to 3 days, you will be notified of positive test results only and additional treatment sent in as needed  Urine pregnancy test is negative  In addition: Avoid baths, hot tubs and whirlpool spas.  Don't use scented or harsh soaps Avoid irritants. These include scented tampons and pads. Wipe from front to back after using the toilet. Don't douche. Your vagina doesn't require cleansing other than normal bathing.  Use a condom.  Wear cotton underwear, this fabric absorbs some moisture.

## 2023-12-04 NOTE — ED Triage Notes (Signed)
 Patient reports lower back pain and vaginal pain x 2 weeks. Rates 7/10.

## 2023-12-04 NOTE — ED Provider Notes (Signed)
 [name]    CSN: [id number] Arrival date & time: 12/04/23  1336      History   Chief Complaint Chief Complaint  Patient presents with   Back Pain    HPI Robin Pineda is a 23 y.o. female.   Patient presents for evaluation of persisting abdominal pain, mid low back pain and vaginal pain beginning 2 weeks ago.  Initially experiencing a watery thin discharge with a light odor which has improved.  Completed e-visit and was given Diflucan , slightly helpful.  Denies vaginal itching, urinary symptoms.  Last menstrual period 11/05/2023.  Sexually active, no known exposure, no concern for STI.  Past Medical History:  Diagnosis Date   Anemia affecting pregnancy 05/17/2023   28 wks: Hgb 10.3     Anxiety    Arm fracture, left    Arm fracture, right    Asthma    Broken nose    Depression    Encounter for care or examination of lactating mother 07/02/2023   Enlarged tonsils    Headache    Postpartum care following vaginal delivery 07/02/2023   POTS (postural orthostatic tachycardia syndrome)    Rh negative state in antepartum period 02/24/2023   Rhogam 04/18/23     Seasonal allergies    Supervision of normal pregnancy 11/25/2022              Clinical Staff    Provider      Office Location     [facility]    Dating     07/11/2023, LMP      Language     English    Anatomy US      normal      Flu Vaccine     declined    Genetic Screen     NIPS: low risk      TDaP vaccine     04/18/23    Hgb A1C or   GTT    Early :  Third trimester :       Covid    declined         LAB RESULTS       Rhogam     AB/Negative/-- (08/26 1157)     B   UTI (urinary tract infection)     Patient Active Problem List   Diagnosis Date Noted   General counseling and advice on contraceptive management 08/31/2023   Bipolar disorder (HCC) 01/02/2023   MDD (major depressive disorder), recurrent, severe, 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   Migraine without aura and without status migrainosus, not  intractable 03/04/2014   Episodic tension-type headache 03/04/2014   Obesity 03/04/2014   Disequilibrium 03/04/2014    Past Surgical History:  Procedure Laterality Date   OTHER SURGICAL HISTORY Right 2011   Pins placed to repair break in right arm   TONSILECTOMY, ADENOIDECTOMY, BILATERAL MYRINGOTOMY AND TUBES     TONSILLECTOMY      OB History     Gravida  1   Para  1   Term  1   Preterm      AB      Living  1      SAB      IAB      Ectopic      Multiple  0   Live Births  1            Home Medications    Prior to Admission medications   Medication Sig Start Date End Date Taking? Authorizing Provider  metroNIDAZOLE  (FLAGYL ) 500 MG tablet Take 1 tablet (500 mg total) by mouth 2 (two) times daily. 12/04/23  Yes Cowan Pilar R, NP  ABILIFY 5 MG tablet 10 mg.    [provider]  albuterol  (VENTOLIN  HFA) 108 (90 Base) MCG/ACT inhaler Inhale 2 puffs into the lungs every 6 (six) hours as needed for wheezing or shortness of breath. 09/26/23   Gladis Elsie BROCKS, PA-C  norelgestromin -ethinyl estradiol  (XULANE) 150-35 MCG/24HR transdermal patch Place 1 patch onto the skin once a week. 08/31/23   Justino Eleanor HERO, CNM  FLUoxetine  (PROZAC ) 20 MG capsule Take 1 capsule (20 mg total) by mouth daily. 11/14/17 02/18/19  Thomas, Lashunda, NP  fluticasone  (FLONASE ) 50 MCG/ACT nasal spray Place 1 spray into both nostrils daily.  12/29/19  [provider]    Family History Family History  Problem Relation Age of Onset   Lupus Mother    Heart failure Father    Diabetes Father    Hypertension Father    Healthy Brother    Other Brother        cardiomyopathy; heart block   Healthy Maternal Grandmother    Healthy Maternal Grandfather    Other Paternal Grandmother        cardiomyopathy   Healthy Paternal Grandfather     Social History Social History   Tobacco Use   Smoking status: Former    Current packs/day: 0.00    Types: Cigarettes    Quit  date: 10/2022    Years since quitting: 1.1   Smokeless tobacco: Never   Tobacco comments:    Trying to stop using patches  Vaping Use   Vaping status: Some Days   Substances: Nicotine , THC, Flavoring  Substance Use Topics   Alcohol use: No   Drug use: Yes    Frequency: 7.0 times per week    Types: Marijuana    Comment: daily marijuana, 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     Allergies   Cinnamon and Cinnamon   Review of Systems Review of Systems   Physical Exam Triage Vital Signs ED Triage Vitals  Encounter Vitals Group     BP 12/04/23 1452 107/71     Girls Systolic BP Percentile --      Girls Diastolic BP Percentile --      Boys Systolic BP Percentile --      Boys Diastolic BP Percentile --      Pulse Rate 12/04/23 1452 65     Resp 12/04/23 1452 18     Temp 12/04/23 1452 98.9 F (37.2 C)     Temp Source 12/04/23 1452 Oral     SpO2 12/04/23 1452 98 %     Weight --      Height --      Head Circumference --      Peak Flow --      Pain Score 12/04/23 1500 7     Pain Loc --      Pain Education --      Exclude from Growth Chart --    No data found.  Updated Vital Signs BP 107/71 (BP Location: Right Arm)   Pulse 65   Temp 98.9 F (37.2 C) (Oral)   Resp 18   LMP 11/05/2023 (Approximate)   SpO2 98%   Breastfeeding No   Visual Acuity Right Eye Distance:   Left Eye Distance:   Bilateral Distance:    Right Eye Near:   Left Eye Near:    Bilateral Near:  Physical Exam Constitutional:      Appearance: Normal appearance.  Eyes:     Extraocular Movements: Extraocular movements intact.  Pulmonary:     Effort: Pulmonary effort is normal.  Abdominal:     Tenderness: There is no abdominal tenderness. There is no right CVA tenderness, left CVA tenderness or guarding.  Genitourinary:    Comments: deferred Neurological:     Mental Status: She is alert and oriented to person, place, and time.      UC Treatments / Results  Labs (all labs ordered are listed, but  only abnormal results are displayed) Labs Reviewed  POCT URINE PREGNANCY  CERVICOVAGINAL ANCILLARY ONLY    EKG   Radiology No results found.  Procedures Procedures (including critical care time)  Medications Ordered in UC Medications - No data to display  Initial Impression / Assessment and Plan / UC Course  I have reviewed the triage vital signs and the nursing notes.  Pertinent labs & imaging results that were available during my care of the patient were reviewed by me and considered in my medical decision making (see chart for details).  Suprapubic pain  No abdominal tenderness noted on exam, stable for outpatient management, urine pregnancy test is negative, discussed findings, vaginal swab checking for yeast and bacterial vaginosis pending, will treat additionally per protocol, metronidazole  prescribed and advised abstaining from alcohol, sexual intercourse until symptoms resolve, recommended nonpharmacological supportive care and advised follow-up as needed Final Clinical Impressions(s) / UC Diagnoses   Final diagnoses:  Suprapubic pain     Discharge Instructions      Today you are being treated prophylactically for  Bacterial vaginosis   Take Metronidazole  500 mg twice a day for 7 days, do not drink alcohol while using medication, this will make you feel sick   Bacterial vaginosis which results from an overgrowth of one on several organisms that are normally present in your vagina. Vaginosis is an inflammation of the vagina that can result in discharge, itching and pain.  Vaginal swab checking for yeast and bacterial vaginosis pending 2 to 3 days, you will be notified of positive test results only and additional treatment sent in as needed  Urine pregnancy test is negative  In addition: Avoid baths, hot tubs and whirlpool spas.  Don't use scented or harsh soaps Avoid irritants. These include scented tampons and pads. Wipe from front to back after using the  toilet. Don't douche. Your vagina doesn't require cleansing other than normal bathing.  Use a condom.  Wear cotton underwear, this fabric absorbs some moisture.        ED Prescriptions     Medication Sig Dispense Auth. Provider   metroNIDAZOLE  (FLAGYL ) 500 MG tablet Take 1 tablet (500 mg total) by mouth 2 (two) times daily. 14 tablet Blimie Vaness R, NP      PDMP not reviewed this encounter.   Teresa Shelba SAUNDERS, [place] 12/04/23 [phone]

## 2023-12-06 ENCOUNTER — Ambulatory Visit (HOSPITAL_COMMUNITY): Payer: Self-pay

## 2023-12-06 LAB — CERVICOVAGINAL ANCILLARY ONLY
Bacterial Vaginitis (gardnerella): POSITIVE — AB
Candida Glabrata: NEGATIVE
Candida Vaginitis: NEGATIVE
Comment: NEGATIVE
Comment: NEGATIVE
Comment: NEGATIVE

## 2023-12-26 ENCOUNTER — Telehealth: Payer: MEDICAID | Admitting: Physician Assistant

## 2023-12-26 DIAGNOSIS — K047 Periapical abscess without sinus: Secondary | ICD-10-CM | POA: Diagnosis not present

## 2023-12-26 MED ORDER — AMOXICILLIN-POT CLAVULANATE 875-125 MG PO TABS: 1.0000 | ORAL_TABLET | Freq: Two times a day (BID) | ORAL | 0 refills | Status: AC

## 2023-12-26 NOTE — Progress Notes (Signed)
 Virtual Visit Consent   Robin Pineda, you are scheduled for a virtual visit with a [facility] provider today. Just as with appointments in the office, your consent must be obtained to participate. Your consent will be active for this visit and any virtual visit you may have with one of our providers in the next 365 days. If you have a MyChart account, a copy of this consent can be sent to you electronically.  As this is a virtual visit, video technology does not allow for your provider to perform a traditional examination. This may limit your provider's ability to fully assess your condition. If your provider identifies any concerns that need to be evaluated in person or the need to arrange testing (such as labs, EKG, etc.), we will make arrangements to do so. Although advances in technology are sophisticated, we cannot ensure that it will always work on either your end or our end. If the connection with a video visit is poor, the visit may have to be switched to a telephone visit. With either a video or telephone visit, we are not always able to ensure that we have a secure connection.  By engaging in this virtual visit, you consent to the provision of healthcare and authorize for your insurance to be billed (if applicable) for the services provided during this visit. Depending on your insurance coverage, you may receive a charge related to this service.  I need to obtain your verbal consent now. Are you willing to proceed with your visit today? KHIANA CAMINO has provided verbal consent on 12/26/2023 for a virtual visit (video or telephone). Robin Pineda, [place]  Date: 12/26/2023 9:33 AM   Virtual Visit via Video Note   I, Robin Pineda, connected with  ALLYSE FREGEAU  ([id number], [birth year]) on 12/26/23 at  9:30 AM EDT by a video-enabled telemedicine application and verified that I am speaking with the correct person using two identifiers.  Location: Patient: Virtual Visit  Location Patient: Home Provider: Virtual Visit Location Provider: Home Office   I discussed the limitations of evaluation and management by telemedicine and the availability of in person appointments. The patient expressed understanding and agreed to proceed.    History of Present Illness: Robin Pineda is a 23 y.o. who identifies as a female who was assigned female at birth, and is being seen today for around 1 week of pain and swelling around L lower premolar. Has had prior work done on this tooth (filling) and wondered if this cam out or tooth cracked. Notes low-grade fever the other night with chills, but none since. Is hydrating well. Trying to avoid chewing on that affected side. Denies concern for pregnancy.     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   Migraine without aura and without status migrainosus, not intractable 03/04/2014   Episodic tension-type headache 03/04/2014   Obesity 03/04/2014   Disequilibrium 03/04/2014    Allergies:  Allergies  Allergen Reactions   Cinnamon Hives   Cinnamon Swelling   Medications:  Current Outpatient Medications:    amoxicillin -clavulanate (AUGMENTIN ) 875-125 MG tablet, Take 1 tablet by mouth 2 (two) times daily., Disp: 14 tablet, Rfl: 0   ABILIFY 5 MG tablet, 10 mg., Disp: , Rfl:    albuterol  (VENTOLIN  HFA) 108 (  90 Base) MCG/ACT inhaler, Inhale 2 puffs into the lungs every 6 (six) hours as needed for wheezing or shortness of breath., Disp: 8 g, Rfl: 0   norelgestromin -ethinyl estradiol  (XULANE) 150-35 MCG/24HR transdermal patch, Place 1 patch onto the skin once a week., Disp: 9 patch, Rfl: 3  Observations/Objective: Patient is well-developed, well-nourished in no acute distress.  Resting comfortably at home.   Head is normocephalic, atraumatic.  No labored breathing. Speech is clear and coherent with logical content.  Patient is alert and oriented at baseline.   Assessment and Plan: 1. Dental abscess (Primary) - amoxicillin -clavulanate (AUGMENTIN ) 875-125 MG tablet; Take 1 tablet by mouth 2 (two) times daily.  Dispense: 14 tablet; Refill: 0  Supportive measures and OTC medications reviewed.  She is to call dental provider to schedule follow-up.  Augmentin  per orders. UC precautions reviewed.  Follow Up Instructions: I discussed the assessment and treatment plan with the patient. The patient was provided an opportunity to ask questions and all were answered. The patient agreed with the plan and demonstrated an understanding of the instructions.  A copy of instructions were sent to the patient via MyChart unless otherwise noted below.   The patient was advised to call back or seek an in-person evaluation if the symptoms worsen or if the condition fails to improve as anticipated.    Robin Velma Lunger, PA-C

## 2023-12-26 NOTE — Patient Instructions (Signed)
 Robin Pineda, thank you for joining Robin Velma Lunger, PA-C for today's virtual visit.  While this provider is not your primary care provider (PCP), if your PCP is located in our provider database this encounter information will be shared with them immediately following your visit.   A [facility] MyChart account gives you access to today's visit and all your visits, tests, and labs performed at [facility]  click here if you don't have a [facility] MyChart account or go to [email or web address]  Consent: (Patient) Robin Pineda provided verbal consent for this virtual visit at the beginning of the encounter.  Current Medications:  Current Outpatient Medications:    ABILIFY 5 MG tablet, 10 mg., Disp: , Rfl:    albuterol  (VENTOLIN  HFA) 108 (90 Base) MCG/ACT inhaler, Inhale 2 puffs into the lungs every 6 (six) hours as needed for wheezing or shortness of breath., Disp: 8 g, Rfl: 0   metroNIDAZOLE  (FLAGYL ) 500 MG tablet, Take 1 tablet (500 mg total) by mouth 2 (two) times daily., Disp: 14 tablet, Rfl: 0   norelgestromin -ethinyl estradiol  (XULANE) 150-35 MCG/24HR transdermal patch, Place 1 patch onto the skin once a week., Disp: 9 patch, Rfl: 3   Medications ordered in this encounter:  No orders of the defined types were placed in this encounter.    *If you need refills on other medications prior to your next appointment, please contact your pharmacy*  Follow-Up: Call back or seek an in-person evaluation if the symptoms worsen or if the condition fails to improve as anticipated.  [facility] [phone]  Other Instructions Dental Abscess  A dental abscess is an area of pus in or around a tooth. It comes from an infection. It can cause pain and other symptoms. Treatment will help with symptoms and prevent the infection from spreading. What are the causes? This condition is caused by an infection in or around the tooth. This can be from: Very bad  tooth decay (cavities). A bad injury to the tooth, such as a broken or chipped tooth. What increases the risk? The risk to get an abscess is higher in males. It is also more likely in people who: Have dental decay. Have very bad gum disease. Eat sugary snacks between meals. Use tobacco. Have diabetes. Have a weak disease-fighting system (immune system). Do not brush their teeth regularly. What are the signs or symptoms? Some mild symptoms are: Tenderness. Bad breath. Fever. A sharp, sour taste in the mouth. Pain in and around the infected tooth. Worse symptoms of this condition include: Swollen neck glands. Chills. Pus draining around the tooth. Swelling and redness around the tooth, the mouth, or the face. Very bad pain in and around the tooth. The worst symptoms can include: Difficulty swallowing. Difficulty opening your mouth. Feeling like you may vomit or vomiting. How is this treated? This is treated by getting rid of the infection. Your dentist will discuss ways to do this, including: Antibiotic medicines. Antibacterial mouth rinse. An incision in the abscess to drain out the pus. A root canal. Removing the tooth. Follow these instructions at home: Medicines Take over-the-counter and prescription medicines only as told by your dentist. If you were prescribed an antibiotic medicine, take it as told by your dentist. Do not stop taking it even if you start to feel better. If you were prescribed a gel that has numbing medicine in it, use it exactly as told. Ask your dentist if you should avoid driving or using machines  while you are taking your medicine. General instructions Rinse your mouth often with salt water. To make salt water, dissolve -1 tsp (3-6 g) of salt in 1 cup (237 mL) of warm water. Eat a soft diet while your mouth is healing. Drink enough fluid to keep your pee (urine) pale yellow. Do not apply heat to the outside of your mouth. Do not smoke or use any  products that contain nicotine  or tobacco. If you need help quitting, ask your dentist. Keep all follow-up visits. Prevent an abscess Brush your teeth every morning and every night. Use fluoride toothpaste. Floss your teeth each day. Get dental cleanings as often as told by your dentist. Think about getting dental sealant put on teeth that have deep holes (decay). Drink water that has fluoride in it. Most tap water has fluoride. Check the label on bottled water to see if it has fluoride in it. Drink water instead of sugary drinks. Eat healthy meals and snacks. Wear a mouth guard or face shield when you play sports. Contact a doctor if: Your pain is worse and medicine does not help. Get help right away if: You have a fever or chills. Your symptoms suddenly get worse. You have a very bad headache. You have problems breathing or swallowing. You have trouble opening your mouth. You have swelling in your neck or close to your eye. These symptoms may be an emergency. Get help right away. Call your local emergency services (911 in the [place] Do not wait to see if the symptoms will go away. Do not drive yourself to the hospital. Summary A dental abscess is an area of pus in or around a tooth. It is caused by an infection. Treatment will help with symptoms and prevent the infection from spreading. Take over-the-counter and prescription medicines only as told by your dentist. To prevent an abscess, take good care of your teeth. Brush your teeth every morning and night. Use floss every day. Get dental cleanings as often as told by your dentist. This information is not intended to replace advice given to you by your health care provider. Make sure you discuss any questions you have with your health care provider. Document Revised: 07/01/2020 Document Reviewed: 07/02/2020 Elsevier Patient Education  2024 [employer]   If you have been instructed to have an in-person evaluation today at a  local Urgent Care facility, please use the link below. It will take you to a list of all of our available [facility], including address, phone number and hours of operation. Please do not delay care.  [facility]  If you or a family member do not have a primary care provider, use the link below to schedule a visit and establish care. When you choose a [facility] primary care physician or advanced practice provider, you gain a long-term partner in health. Find a Primary Care Provider  Learn more about [facility] in-office and virtual care options: [place] - Get Care Now

## 2024-01-17 ENCOUNTER — Telehealth: Payer: MEDICAID | Admitting: Family Medicine

## 2024-01-17 DIAGNOSIS — J019 Acute sinusitis, unspecified: Secondary | ICD-10-CM | POA: Diagnosis not present

## 2024-01-17 DIAGNOSIS — B9689 Other specified bacterial agents as the cause of diseases classified elsewhere: Secondary | ICD-10-CM | POA: Diagnosis not present

## 2024-01-17 MED ORDER — AZITHROMYCIN 250 MG PO TABS: ORAL_TABLET | ORAL | 0 refills | Status: AC

## 2024-01-17 NOTE — Progress Notes (Signed)
 Virtual Visit Consent   [name], you are scheduled for a virtual visit with a [facility] provider today. Just as with appointments in the office, your consent must be obtained to participate. Your consent will be active for this visit and any virtual visit you may have with one of our providers in the next 365 days. If you have a MyChart account, a copy of this consent can be sent to you electronically.  As this is a virtual visit, video technology does not allow for your provider to perform a traditional examination. This may limit your provider's ability to fully assess your condition. If your provider identifies any concerns that need to be evaluated in person or the need to arrange testing (such as labs, EKG, etc.), we will make arrangements to do so. Although advances in technology are sophisticated, we cannot ensure that it will always work on either your end or our end. If the connection with a video visit is poor, the visit may have to be switched to a telephone visit. With either a video or telephone visit, we are not always able to ensure that we have a secure connection.  By engaging in this virtual visit, you consent to the provision of healthcare and authorize for your insurance to be billed (if applicable) for the services provided during this visit. Depending on your insurance coverage, you may receive a charge related to this service.  I need to obtain your verbal consent now. Are you willing to proceed with your visit today? Robin Pineda has provided verbal consent on 01/17/2024 for a virtual visit (video or telephone). [name], NP  Date: 01/17/2024 11:03 AM   Virtual Visit via Video Note   I, [name], connected with  Robin Pineda  ([id number], 21-30-2002) on 01/17/24 at 11:00 AM EDT by a video-enabled telemedicine application and verified that I am speaking with the correct person using two identifiers.  Location: Patient: Virtual Visit Location  Patient: Home Provider: Virtual Visit Location Provider: Home Office   I discussed the limitations of evaluation and management by telemedicine and the availability of in person appointments. The patient expressed understanding and agreed to proceed.    History of Present Illness: Robin Pineda is a 23 y.o. who identifies as a female who was assigned female at birth, and is being seen today for sinus  On and off being sick for a week, but then developed some more persistent symptoms after the weekend. Mucus is clear to now green Feels feverish, chills, sore throat and chest pain due to coughing. Some ear pain as well. Has been using thera flu and cough drops  Denies shortness of breath. History of asthma   Son is also sick- he is 53 months old but has cough and stuffiness. He was not tested.  Problems:  Patient Active Problem List   Diagnosis Date Noted   General counseling and advice on contraceptive management 08/31/2023   Bipolar disorder (HCC) 01/02/2023   MDD (major depressive disorder), recurrent, severe, 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   Migraine without aura and without status migrainosus, not intractable 03/04/2014   Episodic tension-type headache 03/04/2014   Obesity 03/04/2014   Disequilibrium 03/04/2014    Allergies:  Allergies  Allergen Reactions   Cinnamon Hives   Cinnamon Swelling   Medications:  Current Outpatient Medications:    ABILIFY 5 MG tablet, 10 mg., Disp: , Rfl:    albuterol  (VENTOLIN  HFA) 108 (90 Base) MCG/ACT inhaler, Inhale  2 puffs into the lungs every 6 (six) hours as needed for wheezing or shortness of breath., Disp: 8 g, Rfl: 0   amoxicillin -clavulanate (AUGMENTIN ) 875-125 MG tablet, Take 1 tablet by mouth 2 (two) times daily., Disp: 14 tablet, Rfl: 0   norelgestromin -ethinyl estradiol  (XULANE) 150-35 MCG/24HR transdermal patch, Place 1 patch onto the skin once a week., Disp: 9 patch, Rfl: 3  Observations/Objective: Patient is well-developed,  well-nourished in no acute distress.  Resting comfortably  at home.  Head is normocephalic, atraumatic.  No labored breathing.  Speech is clear and coherent with logical content.  Patient is alert and oriented at baseline.  Cough and congestion present    Assessment and Plan:   1. Acute bacterial sinusitis (Primary)  - azithromycin  (ZITHROMAX ) 250 MG tablet; Take 2 tablets on day 1, then 1 tablet daily on days 2 through 5  Dispense: 6 tablet; Refill: 0  URI recommendations: - Increased rest - Increasing Fluids - Acetaminophen  / ibuprofen  as needed for fever/pain.  - Salt water gargling, chloraseptic spray and throat lozenges - Saline nasal spray if congestion or if nasal passages feel dry. - Humidifying the air.     Reviewed side effects, risks and benefits of medication.    Patient acknowledged agreement and understanding of the plan.   Past Medical, Surgical, Social History, Allergies, and Medications have been Reviewed.    Follow Up Instructions: I discussed the assessment and treatment plan with the patient. The patient was provided an opportunity to ask questions and all were answered. The patient agreed with the plan and demonstrated an understanding of the instructions.  A copy of instructions were sent to the patient via MyChart unless otherwise noted below.    The patient was advised to call back or seek an in-person evaluation if the symptoms worsen or if the condition fails to improve as anticipated.    [name], NP

## 2024-01-17 NOTE — Patient Instructions (Addendum)
  Robin Pineda, thank you for joining [name], NP for today's virtual visit.  While this provider is not your primary care provider (PCP), if your PCP is located in our provider database this encounter information will be shared with them immediately following your visit.   A [facility] MyChart account gives you access to today's visit and all your visits, tests, and labs performed at [facility]  click here if you don't have a [facility] MyChart account or go to [email or web address]  Consent: (Patient) Robin Pineda provided verbal consent for this virtual visit at the beginning of the encounter.  Current Medications:  Current Outpatient Medications:    azithromycin  (ZITHROMAX ) 250 MG tablet, Take 2 tablets on day 1, then 1 tablet daily on days 2 through 5, Disp: 6 tablet, Rfl: 0   ABILIFY 5 MG tablet, 10 mg., Disp: , Rfl:    albuterol  (VENTOLIN  HFA) 108 (90 Base) MCG/ACT inhaler, Inhale 2 puffs into the lungs every 6 (six) hours as needed for wheezing or shortness of breath., Disp: 8 g, Rfl: 0   amoxicillin -clavulanate (AUGMENTIN ) 875-125 MG tablet, Take 1 tablet by mouth 2 (two) times daily., Disp: 14 tablet, Rfl: 0   norelgestromin -ethinyl estradiol  (XULANE) 150-35 MCG/24HR transdermal patch, Place 1 patch onto the skin once a week., Disp: 9 patch, Rfl: 3   Medications ordered in this encounter:  Meds ordered this encounter  Medications   azithromycin  (ZITHROMAX ) 250 MG tablet    Sig: Take 2 tablets on day 1, then 1 tablet daily on days 2 through 5    Dispense:  6 tablet    Refill:  0    Supervising Provider:   LAMPTEY, PHILIP O [phone]     *If you need refills on other medications prior to your next appointment, please contact your pharmacy*  Follow-Up: Call back or seek an in-person evaluation if the symptoms worsen or if the condition fails to improve as anticipated.  [facility] [phone]  Other Instructions  URI  recommendations: - Increased rest - Increasing Fluids - Acetaminophen  / ibuprofen  as needed for fever/pain.  - Salt water gargling, chloraseptic spray and throat lozenges - Saline nasal spray if congestion or if nasal passages feel dry. - Humidifying the air.   If you have been instructed to have an in-person evaluation today at a local Urgent Care facility, please use the link below. It will take you to a list of all of our available [facility], including address, phone number and hours of operation. Please do not delay care.  [facility]  If you or a family member do not have a primary care provider, use the link below to schedule a visit and establish care. When you choose a [facility] primary care physician or advanced practice provider, you gain a long-term partner in health. Find a Primary Care Provider  Learn more about [facility]'s in-office and virtual care options: [facility] - Get Care Now

## 2024-02-07 ENCOUNTER — Ambulatory Visit: Payer: MEDICAID | Admitting: Obstetrics & Gynecology

## 2024-03-17 ENCOUNTER — Emergency Department (HOSPITAL_COMMUNITY): Payer: MEDICAID

## 2024-03-17 ENCOUNTER — Emergency Department (HOSPITAL_COMMUNITY)
Admission: EM | Admit: 2024-03-17 | Discharge: 2024-03-17 | Disposition: A | Discharge status: Home / Self Care | Payer: MEDICAID | Attending: Emergency Medicine | Admitting: Emergency Medicine

## 2024-03-17 ENCOUNTER — Other Ambulatory Visit: Payer: Self-pay

## 2024-03-17 ENCOUNTER — Encounter (HOSPITAL_COMMUNITY): Payer: Self-pay | Admitting: *Deleted

## 2024-03-17 DIAGNOSIS — D72829 Elevated white blood cell count, unspecified: Secondary | ICD-10-CM | POA: Diagnosis not present

## 2024-03-17 DIAGNOSIS — R197 Diarrhea, unspecified: Secondary | ICD-10-CM | POA: Insufficient documentation

## 2024-03-17 DIAGNOSIS — Z7951 Long term (current) use of inhaled steroids: Secondary | ICD-10-CM | POA: Insufficient documentation

## 2024-03-17 DIAGNOSIS — D649 Anemia, unspecified: Secondary | ICD-10-CM | POA: Insufficient documentation

## 2024-03-17 DIAGNOSIS — J45909 Unspecified asthma, uncomplicated: Secondary | ICD-10-CM | POA: Diagnosis not present

## 2024-03-17 DIAGNOSIS — R112 Nausea with vomiting, unspecified: Secondary | ICD-10-CM | POA: Insufficient documentation

## 2024-03-17 DIAGNOSIS — R1084 Generalized abdominal pain: Secondary | ICD-10-CM | POA: Diagnosis not present

## 2024-03-17 LAB — CBC
HCT: 36.1 % (ref 36.0–46.0)
Hemoglobin: 11.6 g/dL — ABNORMAL LOW (ref 12.0–15.0)
MCH: 25.4 pg — ABNORMAL LOW (ref 26.0–34.0)
MCHC: 32.1 g/dL (ref 30.0–36.0)
MCV: 79.2 fL — ABNORMAL LOW (ref 80.0–100.0)
Platelets: 269 K/uL (ref 150–400)
RBC: 4.56 MIL/uL (ref 3.87–5.11)
RDW: 14.8 % (ref 11.5–15.5)
WBC: 13.8 K/uL — ABNORMAL HIGH (ref 4.0–10.5)
nRBC: 0 % (ref 0.0–0.2)

## 2024-03-17 LAB — URINALYSIS, ROUTINE W REFLEX MICROSCOPIC
Bilirubin Urine: NEGATIVE
Glucose, UA: NEGATIVE mg/dL
Hgb urine dipstick: NEGATIVE
Ketones, ur: NEGATIVE mg/dL
Nitrite: NEGATIVE
Protein, ur: NEGATIVE mg/dL
Specific Gravity, Urine: 1.023 (ref 1.005–1.030)
pH: 5 (ref 5.0–8.0)

## 2024-03-17 LAB — COMPREHENSIVE METABOLIC PANEL WITH GFR
ALT: 11 U/L (ref 0–44)
AST: 19 U/L (ref 15–41)
Albumin: 4 g/dL (ref 3.5–5.0)
Alkaline Phosphatase: 71 U/L (ref 38–126)
Anion gap: 15 (ref 5–15)
BUN: 10 mg/dL (ref 6–20)
CO2: 21 mmol/L — ABNORMAL LOW (ref 22–32)
Calcium: 9.5 mg/dL (ref 8.9–10.3)
Chloride: 102 mmol/L (ref 98–111)
Creatinine, Ser: 0.67 mg/dL (ref 0.44–1.00)
GFR, Estimated: >60 mL/min (ref >60)
Glucose, Bld: 98 mg/dL (ref 70–99)
Potassium: 3.6 mmol/L (ref 3.5–5.1)
Sodium: 138 mmol/L (ref 135–145)
Total Bilirubin: 0.4 mg/dL (ref 0.0–1.2)
Total Protein: 7.2 g/dL (ref 6.5–8.1)

## 2024-03-17 LAB — LIPASE, BLOOD: Lipase: 29 U/L (ref 11–51)

## 2024-03-17 LAB — HCG, SERUM, QUALITATIVE: Preg, Serum: NEGATIVE

## 2024-03-17 MED ORDER — IOHEXOL 350 MG/ML SOLN
75.0000 mL | Freq: Once | INTRAVENOUS | Status: AC | PRN
Start: 2024-03-17 — End: 2024-03-17
  Administered 2024-03-17: 75 mL via INTRAVENOUS

## 2024-03-17 NOTE — ED Triage Notes (Signed)
 Pt c/o worsening abd pain with nausea and diarrhea ongoing for the past several months. LMP approximately one week ago.

## 2024-03-17 NOTE — Discharge Instructions (Signed)
 As discussed, please follow-up with primary care and GI provider.  Seek emergency care if experiencing any new or worsening symptoms.

## 2024-03-17 NOTE — ED Notes (Signed)
 Patient returned from CT

## 2024-03-17 NOTE — ED Notes (Signed)
 Notified by radiology that patient IV infiltrated. IV removed. Site clean, dry, intact. Will continue to monitor insertion site.

## 2024-03-17 NOTE — ED Notes (Signed)
 Patient transported to CT

## 2024-03-17 NOTE — ED Provider Notes (Signed)
 [place] EMERGENCY DEPARTMENT AT [facility] Provider Note   CSN: [id number] Arrival date & time: 03/17/24  8091     Patient presents with: Abdominal Pain   Robin Pineda is a 23 y.o. female with PMHx migraines, asthma, POTS who presents to ED concerned for intermittent episodes of abdominal pain, nausea, vomiting, and diarrhea over the past several months. Last episode of vomiting and diarrhea was this morning. Denies hematemesis or hematochezia.  Abdominal pain starts in suprapubic area and radiates up towards epigastric area. Patient also endorsing intermittent subjective fevers.  Denies dysuria, hematuria, vaginal discharge.  Denies recent ABX use, hx of crohn's/IBS, recent travel, suspicious food intake.     Abdominal Pain      Prior to Admission medications   Medication Sig Start Date End Date Taking? Authorizing Provider  ABILIFY 5 MG tablet 10 mg.    [provider]  albuterol  (VENTOLIN  HFA) 108 (90 Base) MCG/ACT inhaler Inhale 2 puffs into the lungs every 6 (six) hours as needed for wheezing or shortness of breath. 09/26/23   Gladis Elsie BROCKS, PA-C  amoxicillin -clavulanate (AUGMENTIN ) 875-125 MG tablet Take 1 tablet by mouth 2 (two) times daily. 12/26/23   Gladis Elsie BROCKS, PA-C  azithromycin  (ZITHROMAX ) 250 MG tablet Take 2 tablets on day 1, then 1 tablet daily on days 2 through 5 01/17/24   Moishe Chiquita HERO, NP  norelgestromin -ethinyl estradiol  (XULANE) 150-35 MCG/24HR transdermal patch Place 1 patch onto the skin once a week. 08/31/23   Justino Eleanor HERO, CNM  FLUoxetine  (PROZAC ) 20 MG capsule Take 1 capsule (20 mg total) by mouth daily. 11/14/17 02/18/19  Thomas, Lashunda, NP  fluticasone  (FLONASE ) 50 MCG/ACT nasal spray Place 1 spray into both nostrils daily.  12/29/19  [provider]    Allergies: Cinnamon and Cinnamon    Review of Systems  Gastrointestinal:  Positive for abdominal pain.    Updated Vital Signs BP 134/86 (BP  Location: Right Arm)   Pulse 83   Temp 98.8 F (37.1 C)   Resp 14   Ht 5' 2 (1.575 m)   Wt 77.6 kg   LMP 03/04/2024   SpO2 100%   BMI 31.29 kg/m   Physical Exam Vitals and nursing note reviewed.  Constitutional:      General: She is not in acute distress.    Appearance: She is not ill-appearing or toxic-appearing.  HENT:     Head: Normocephalic and atraumatic.     Mouth/Throat:     Mouth: Mucous membranes are moist.     Pharynx: No posterior oropharyngeal erythema.  Eyes:     General: No scleral icterus.       Right eye: No discharge.        Left eye: No discharge.     Conjunctiva/sclera: Conjunctivae normal.  Cardiovascular:     Rate and Rhythm: Normal rate and regular rhythm.     Pulses: Normal pulses.     Heart sounds: Normal heart sounds. No murmur heard. Pulmonary:     Effort: Pulmonary effort is normal. No respiratory distress.     Breath sounds: Normal breath sounds. No wheezing, rhonchi or rales.  Abdominal:     General: Abdomen is flat. Bowel sounds are normal. There is no distension.     Palpations: Abdomen is soft. There is no mass.     Tenderness: There is no abdominal tenderness.  Musculoskeletal:     Right lower leg: No edema.     Left lower leg: No  edema.  Skin:    General: Skin is warm and dry.     Findings: No rash.  Neurological:     General: No focal deficit present.     Mental Status: She is alert and oriented to person, place, and time. Mental status is at baseline.  Psychiatric:        Mood and Affect: Mood normal.        Behavior: Behavior normal.     (all labs ordered are listed, but only abnormal results are displayed) Labs Reviewed  COMPREHENSIVE METABOLIC PANEL WITH GFR - Abnormal; Notable for the following components:      Result Value   CO2 21 (*)    All other components within normal limits  CBC - Abnormal; Notable for the following components:   WBC 13.8 (*)    Hemoglobin 11.6 (*)    MCV 79.2 (*)    MCH 25.4 (*)    All  other components within normal limits  URINALYSIS, ROUTINE W REFLEX MICROSCOPIC - Abnormal; Notable for the following components:   APPearance HAZY (*)    Leukocytes,Ua SMALL (*)    Bacteria, UA RARE (*)    All other components within normal limits  LIPASE, BLOOD  HCG, SERUM, QUALITATIVE    EKG: None  Radiology: CT ABDOMEN PELVIS W CONTRAST Result Date: 03/17/2024 EXAM: CT Abdomen and Pelvis With Contrast 03/17/2024 11:10:12 PM TECHNIQUE: CT of the abdomen and pelvis was performed with the administration of intravenous contrast (75mL iohexol (OMNIPAQUE) 350 mg/mL injection). Multiplanar reformatted images are provided for review. Automated exposure control, iterative reconstruction, and/or weight-based adjustment of the mA/kV was utilized to reduce the radiation dose to as low as reasonably achievable. COMPARISON: None available. CLINICAL HISTORY: Abdominal pain, acute, nonlocalized FINDINGS: LOWER CHEST: No acute abnormality. LIVER: The liver is unremarkable. GALLBLADDER AND BILE DUCTS: Gallbladder is unremarkable. No biliary ductal dilatation. SPLEEN: No acute abnormality. PANCREAS: No acute abnormality. ADRENAL GLANDS: No acute abnormality. KIDNEYS, URETERS AND BLADDER: No stones in the kidneys or ureters. No hydronephrosis. No perinephric or periureteral stranding. Urinary bladder is unremarkable. GI AND BOWEL: Stomach demonstrates no acute abnormality. There is no bowel obstruction. PERITONEUM AND RETROPERITONEUM: No ascites. No free air. VASCULATURE: Aorta is normal in caliber. LYMPH NODES: No lymphadenopathy. REPRODUCTIVE ORGANS: No acute abnormality. BONES AND SOFT TISSUES: No acute osseous abnormality. No focal soft tissue abnormality. IMPRESSION: 1. No acute findings in the abdomen or pelvis. Electronically signed by: Greig Pique MD 03/17/2024 11:14 PM EST RP Workstation: [id number]     Procedures   Medications Ordered in the ED  iohexol (OMNIPAQUE) 350 MG/ML injection 75 mL (75 mLs  Intravenous Contrast Given 03/17/24 2311)                                    Medical Decision Making Amount and/or Complexity of Data Reviewed Radiology: ordered.    This patient presents to the ED for concern of abdominal pain, this involves an extensive number of treatment options, and is a complaint that carries with it a high risk of complications and morbidity.  The differential diagnosis includes gastroenteritis, colitis, small bowel obstruction, appendicitis, cholecystitis, pancreatitis, nephrolithiasis, UTI, pyleonephritis, ruptured ectopic pregnancy, PID, ovarian torsion.   Co morbidities that complicate the patient evaluation  migraines, asthma, POTS    Additional history obtained:  Dr. Loring PCP   Problem List / ED Course / Critical interventions / Medication management  Patient presented for intermittent episodes of abdominal pain, nausea, vomiting, and diarrhea over the past several months.  During my initial interview, patient was not currently having abdominal pain.  Physical exam reassuring.  Patient afebrile with stable vitals. I Ordered, and personally interpreted labs.  CBC with nonspecific leukocytosis at 13.8.  There is also mild anemia with hemoglobin at 11.6.  hCG negative.  CMP with mildly low CO2 at 21.  Lipase within normal limits.  UA not concerning for UTI given 6-10 squamous epithelial cells with the rare bacteria and small leukocytes. I ordered imaging studies including CT Abd/Pelvis with contrast: evaluate for structural/surgical etiology of patients' severe abdominal pain.  I independently visualized and interpreted imaging and I agree with the radiologist interpretation of no acute process. Shared all results with patient.  Answered all questions.  Overall, I believe patient might have been recently suffering from gastroenteritis. There might also be an aspect of food intolerance. No recent vomiting so she appears to be healing well from this.  However,  given that patient states that her intermittent abdominal pain, nausea, vomiting, diarrhea has been happening over the past several months - I recommend following up with GI and will send referral.  Patient agreeable with plan and is ready go home. I have reviewed the patients home medicines and have made adjustments as needed The patient has been appropriately medically screened and/or stabilized in the ED. I have low suspicion for any other emergent medical condition which would require further screening, evaluation or treatment in the ED or require inpatient management. At time of discharge the patient is hemodynamically stable and in no acute distress. I have discussed work-up results and diagnosis with patient and answered all questions. Patient is agreeable with discharge plan. We discussed strict return precautions for returning to the emergency department and they verbalized understanding.     Social Determinants of Health:  none       Final diagnoses:  Generalized abdominal pain  Nausea vomiting and diarrhea    ED Discharge Orders          Ordered    Ambulatory referral to Gastroenterology        03/17/24 2343               Hoy Nidia FALCON, PA-C 03/17/24 2346    Francesca Elsie CROME, MD 03/20/24 1525

## 2024-03-18 ENCOUNTER — Encounter: Payer: Self-pay | Admitting: Gastroenterology

## 2024-03-27 ENCOUNTER — Ambulatory Visit: Payer: MEDICAID | Admitting: Gastroenterology

## 2024-03-27 ENCOUNTER — Other Ambulatory Visit: Payer: MEDICAID

## 2024-03-27 ENCOUNTER — Encounter: Payer: Self-pay | Admitting: Gastroenterology

## 2024-03-27 ENCOUNTER — Ambulatory Visit: Payer: Self-pay | Admitting: Gastroenterology

## 2024-03-27 VITALS — BP 120/68 | HR 69 | Ht 62.0 in | Wt 183.0 lb

## 2024-03-27 DIAGNOSIS — R1013 Epigastric pain: Secondary | ICD-10-CM

## 2024-03-27 DIAGNOSIS — R112 Nausea with vomiting, unspecified: Secondary | ICD-10-CM

## 2024-03-27 DIAGNOSIS — R197 Diarrhea, unspecified: Secondary | ICD-10-CM | POA: Diagnosis not present

## 2024-03-27 LAB — TSH: TSH: 2.51 u[IU]/mL (ref 0.35–5.50)

## 2024-03-27 MED ORDER — PANTOPRAZOLE SODIUM 40 MG PO TBEC: 40.0000 mg | DELAYED_RELEASE_TABLET | Freq: Every day | ORAL | 3 refills | Status: AC

## 2024-03-27 MED ORDER — DICYCLOMINE HCL 10 MG PO CAPS
10.0000 mg | ORAL_CAPSULE | Freq: Three times a day (TID) | ORAL | 3 refills | Status: AC
Start: 2024-03-27 — End: ?

## 2024-03-27 NOTE — Progress Notes (Signed)
 Noted

## 2024-03-27 NOTE — Progress Notes (Signed)
 Chief Complaint: nausea, vomiting, abdominal pain, diarrhea Primary GI MD: Sampson  HPI: Discussed the use of AI scribe software for clinical note transcription with the patient, who gave verbal consent to proceed.  Robin Pineda is a 23 year old female who presents with chronic nausea, vomiting, and diarrhea. She is accompanied by her father.  She has been experiencing abnormal bowel movements for the past eight months, characterized by liquid stools occurring once or twice daily, often in large volumes. No solid bowel movements have been noted, and the longest duration without a bowel movement has been three days. There was one instance of blood in the stool a couple of weeks ago.  She wakes up every morning with nausea and vomiting. The nausea is sometimes accompanied by severe pain after eating, even with small amounts of food. The pain typically starts around the umbilicus, presenting initially as cramping and then progressing to a squeezing sensation. Occasionally, the pain is described as burning, and she recalls feeling 'like she was on fire' one morning.  She has been taking Zofran  for nausea, which provides some relief. She has not taken any antibiotics recently and does not use ibuprofen  or other over-the-counter pain medications.  Her family history is notable for her mother having gastroparesis and lupus, indicating a presence of autoimmune conditions in the family. She has been living in a camper for a few months, but her symptoms began before this change in living situation.  Her past medical history includes elevated white blood cell counts since the birth of her child eight months ago.   PREVIOUS GI WORKUP   CTAP 03/2024: normal, no gallstones.  Past Medical History:  Diagnosis Date   Anemia affecting pregnancy 05/17/2023   28 wks: Hgb 10.3     Anxiety    Arm fracture, left    Arm fracture, right    Asthma    Broken nose    Depression    Encounter for care  or examination of lactating mother 07/02/2023   Enlarged tonsils    Headache    Postpartum care following vaginal delivery 07/02/2023   POTS (postural orthostatic tachycardia syndrome)    Rh negative state in antepartum period 02/24/2023   Rhogam 04/18/23     Seasonal allergies    Supervision of normal pregnancy 11/25/2022              Clinical Staff    Provider      Office Location     [facility]    Dating     07/11/2023, LMP      Language     English    Anatomy US      normal      Flu Vaccine     declined    Genetic Screen     NIPS: low risk      TDaP vaccine     04/18/23    Hgb A1C or   GTT    Early :  Third trimester :       Covid    declined         LAB RESULTS       Rhogam     AB/Negative/-- (08/26 1157)     B   UTI (urinary tract infection)     Past Surgical History:  Procedure Laterality Date   OTHER SURGICAL HISTORY Right 2011   Pins placed to repair break in right arm   TONSILECTOMY, ADENOIDECTOMY, BILATERAL MYRINGOTOMY AND TUBES  TONSILLECTOMY      Current Outpatient Medications  Medication Sig Dispense Refill   dicyclomine  (BENTYL ) 10 MG capsule Take 1 capsule (10 mg total) by mouth 3 (three) times daily. 30 days 90 capsule 3   pantoprazole  (PROTONIX ) 40 MG tablet Take 1 tablet (40 mg total) by mouth daily. 30 tablet 3   ABILIFY 5 MG tablet 10 mg.     albuterol  (VENTOLIN  HFA) 108 (90 Base) MCG/ACT inhaler Inhale 2 puffs into the lungs every 6 (six) hours as needed for wheezing or shortness of breath. 8 g 0   amoxicillin -clavulanate (AUGMENTIN ) 875-125 MG tablet Take 1 tablet by mouth 2 (two) times daily. 14 tablet 0   azithromycin  (ZITHROMAX ) 250 MG tablet Take 2 tablets on day 1, then 1 tablet daily on days 2 through 5 6 tablet 0   norelgestromin -ethinyl estradiol  (XULANE) 150-35 MCG/24HR transdermal patch Place 1 patch onto the skin once a week. 9 patch 3   No current facility-administered medications for this visit.    Allergies as of 03/27/2024 - Review Complete  03/27/2024  Allergen Reaction Noted   Cinnamon Hives 07/19/2019   Cinnamon Swelling 07/02/2021    Family History  Problem Relation Age of Onset   Lupus Mother    Heart failure Father    Diabetes Father    Hypertension Father    Healthy Brother    Other Brother        cardiomyopathy; heart block   Healthy Maternal Grandmother    Healthy Maternal Grandfather    Other Paternal Grandmother        cardiomyopathy   Healthy Paternal Grandfather     Social History   Socioeconomic History   Marital status: Single    Spouse name: Not on file   Number of children: 0   Years of education: 9   Highest education level: Not on file  Occupational History   Occupation: working on [employer]  Tobacco Use   Smoking status: Former    Current packs/day: 0.00    Types: Cigarettes    Quit date: 10/2022    Years since quitting: 1.4   Smokeless tobacco: Never   Tobacco comments:    Trying to stop using patches  Vaping Use   Vaping status: Some Days   Substances: Nicotine , THC, Flavoring  Substance and Sexual Activity   Alcohol use: No   Drug use: Yes    Frequency: 7.0 times per week    Types: Marijuana    Comment: daily marijuana, 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   Sexual activity: Not Currently    Partners: Male    Birth control/protection: None  Other Topics Concern   Not on file  Social History Narrative   Not on file   Social Drivers of Health   Financial Resource Strain: Medium Risk (11/25/2022)   Overall Financial Resource Strain (CARDIA)    Difficulty of Paying Living Expenses: Somewhat hard  Food Insecurity: No Food Insecurity (07/01/2023)   Hunger Vital Sign    Worried About Running Out of Food in the Last Year: Never true    Ran Out of Food in the Last Year: Never true  Transportation Needs: No Transportation Needs (07/01/2023)   PRAPARE - Transportation    Lack of Transportation (Medical): No    Lack of Transportation (Non-Medical): No  Physical Activity: Insufficiently Active  (11/25/2022)   Exercise Vital Sign    Days of Exercise per Week: 4 days    Minutes of Exercise per Session: 30 min  Stress: No Stress Concern Present (11/25/2022)   [employer] of Occupational Health - Occupational Stress Questionnaire    Feeling of Stress : Not at all  Social Connections: Moderately Isolated (07/01/2023)   Social Connection and Isolation Panel    Frequency of Communication with Friends and Family: Twice a week    Frequency of Social Gatherings with Friends and Family: Twice a week    Attends Religious Services: 1 to 4 times per year    Active Member of [employer] or Organizations: No    Attends Banker Meetings: Never    Marital Status: Never married  Intimate Partner Violence: Not At Risk (07/01/2023)   Humiliation, Afraid, Rape, and Kick questionnaire    Fear of Current or Ex-Partner: No    Emotionally Abused: No    Physically Abused: No    Sexually Abused: No    Review of Systems:    Constitutional: No weight loss, fever, chills, weakness or fatigue HEENT: Eyes: No change in vision               Ears, Nose, Throat:  No change in hearing or congestion Skin: No rash or itching Cardiovascular: No chest pain, chest pressure or palpitations   Respiratory: No SOB or cough Gastrointestinal: See HPI and otherwise negative Genitourinary: No dysuria or change in urinary frequency Neurological: No headache, dizziness or syncope Musculoskeletal: No new muscle or joint pain Hematologic: No bleeding or bruising Psychiatric: No history of depression or anxiety    Physical Exam:  Vital signs: BP 120/68   Pulse 69   Ht 5' 2 (1.575 m)   Wt 183 lb (83 kg)   LMP 03/04/2024   BMI 33.47 kg/m   Constitutional: NAD, alert and cooperative Head:  Normocephalic and atraumatic. Eyes:   PEERL, EOMI. No icterus. Conjunctiva pink. Respiratory: Respirations even and unlabored. Lungs clear to auscultation bilaterally.   No wheezes, crackles, or rhonchi.   Cardiovascular:  Regular rate and rhythm. No peripheral edema, cyanosis or pallor.  Gastrointestinal:  Soft, nondistended, nontender. No rebound or guarding. Normal bowel sounds. No appreciable masses or hepatomegaly. Rectal:  Declines Msk:  Symmetrical without gross deformities. Without edema, no deformity or joint abnormality.  Neurologic:  Alert and  oriented x4;  grossly normal neurologically.  Skin:   Dry and intact without significant lesions or rashes. Psychiatric: Oriented to person, place and time. Demonstrates good judgement and reason without abnormal affect or behaviors.   RELEVANT LABS AND IMAGING: CBC    Component Value Date/Time   WBC 13.8 (H) 03/17/2024 1947   RBC 4.56 03/17/2024 1947   HGB 11.6 (L) 03/17/2024 1947   HGB 10.3 (L) 04/18/2023 1006   HCT 36.1 03/17/2024 1947   HCT 31.7 (L) 04/18/2023 1006   PLT 269 03/17/2024 1947   PLT 298 04/18/2023 1006   MCV 79.2 (L) 03/17/2024 1947   MCV 92 04/18/2023 1006   MCH 25.4 (L) 03/17/2024 1947   MCHC 32.1 03/17/2024 1947   RDW 14.8 03/17/2024 1947   RDW 12.0 04/18/2023 1006   LYMPHSABS 2.4 04/18/2023 1006   MONOABS 0.5 12/05/2020 1527   EOSABS 0.1 04/18/2023 1006   BASOSABS 0.0 04/18/2023 1006    CMP     Component Value Date/Time   NA 138 03/17/2024 1947   NA 140 06/06/2023 1539   K 3.6 03/17/2024 1947   CL 102 03/17/2024 1947   CO2 21 (L) 03/17/2024 1947   GLUCOSE 98 03/17/2024 1947   BUN 10 03/17/2024 1947  BUN 6 06/06/2023 1539   CREATININE 0.67 03/17/2024 1947   CALCIUM  9.5 03/17/2024 1947   PROT 7.2 03/17/2024 1947   PROT 6.0 06/06/2023 1539   ALBUMIN 4.0 03/17/2024 1947   ALBUMIN 3.5 (L) 06/06/2023 1539   AST 19 03/17/2024 1947   ALT 11 03/17/2024 1947   ALKPHOS 71 03/17/2024 1947   BILITOT 0.4 03/17/2024 1947   BILITOT 0.2 06/06/2023 1539   GFRNONAA >60 03/17/2024 1947   GFRAA NOT CALCULATED 02/20/2019 2241     Assessment/Plan:   Nausea/vomiting Epigastric pain Early  satiety Nausea/vomiting and epigastric pain that occur with eating.  Nausea and vomiting is worse in the morning.  Some relief with Zofran .  Mother with lupus and gastroparesis.  Negative CT scan, no gallstones.  No NSAIDs.  Possible patient's symptoms are a result of GERD/esophagitis/PUD.  Dx also includes biliary dyskinesia and gastroparesis -Trial of pantoprazole  40 mg once daily - Educated patient on lifestyle modifications provided patient education handout - Test for H. pylori with stool Diatherix - Gastric emptying study (patient concerned with mother's history) - If all of the above are negative and no improvement with pantoprazole  will consider HIDA scan rule out biliary dyskinesia  Diarrhea Ongoing diarrhea with 2 loose stools per day.  No antibiotic use.  Negative CT scan.  Labs with chronic leukocytosis since childbirth 8 months ago.  New living situation in a camper over the last few months as well.  DDx includes infection, IBD, gut brain axis disorder, IBS - Check TSH - GI pathogen Diatherix - Dicyclomine 3 times daily - Fecal calprotectin - Start fiber supplement 1-2 times daily  Assigned to Dr. Abran today (Wednesday)  Montrey Buist Mollie RIGGERS [facility] 03/27/2024, 9:24 AM  Cc: Loring Tanda Mae, *

## 2024-03-27 NOTE — Patient Instructions (Addendum)
 _______________________________________________________  If your blood pressure at your visit was 140/90 or greater, please contact your primary care physician to follow up on this.  _______________________________________________________  If you are age 21 or older, your body mass index should be between 23-30. Your Body mass index is 33.47 kg/m. If this is out of the aforementioned range listed, please consider follow up with your Primary Care Provider.  If you are age 20 or younger, your body mass index should be between 19-25. Your Body mass index is 33.47 kg/m. If this is out of the aformentioned range listed, please consider follow up with your Primary Care Provider.   ________________________________________________________  The [facility] providers would like to encourage you to use MYCHART to communicate with providers for non-urgent requests or questions.  Due to long hold times on the telephone, sending your provider a message by [facility] may be a faster and more efficient way to get a response.  Please allow 48 business hours for a response.  Please remember that this is for non-urgent requests.  _______________________________________________________  [facility] is using a team-based approach to care.  Your team is made up of your doctor and two to three APPS. Our APPS (Nurse Practitioners and Physician Assistants) work with your physician to ensure care continuity for you. They are fully qualified to address your health concerns and develop a treatment plan. They communicate directly with your gastroenterologist to care for you. Seeing the Advanced Practice Practitioners on your physician's team can help you by facilitating care more promptly, often allowing for earlier appointments, access to diagnostic testing, procedures, and other specialty referrals.   Your provider has requested that you go to the basement level for lab work before leaving today. Press B on the  elevator. The lab is located at the first door on the left as you exit the elevator.  We have sent the following medications to your pharmacy for you to pick up at your convenience: Protonix  Bentyl  You have been scheduled for an appointment with [facility] on 05-31-24 at 920am . Please arrive 10 minutes early for your appointment.  You have been scheduled for a gastric emptying scan at [facility] on 04-05-24 at 8am. Please arrive at least 30 minutes prior to your appointment for registration. Please make certain not to have anything to eat or drink after midnight the night before your test. Hold all stomach medications (ex: Zofran , phenergan , Reglan ) 24 hours prior to your test. If you need to reschedule your appointment, please contact radiology scheduling at [phone]. _____________________________________________________________________ A gastric-emptying study measures how long it takes for food to move through your stomach. There are several ways to measure stomach emptying. In the most common test, you eat food that contains a small amount of radioactive material. A scanner that detects the movement of the radioactive material is placed over your abdomen to monitor the rate at which food leaves your stomach. This test normally takes about 4 hours to complete. _____________________________________________________________________  Your provider has ordered Diathe[facility] stool testing for you. You have received a kit from our office today containing all necessary supplies to complete this test. Please carefully read the stool collection instructions provided in the kit before opening the accompanying materials. In addition, be sure there is a label providing your full name and date of birth on the puritan opti-swab tube that is supplied in the kit (if you do not see a label with this information on your test tube, please make us   aware before test collection!). After  completing the test, you should secure the purtian tube into the specimen biohazard bag. The [facility] E-Req sheet (including date and time of specimen collection) should be placed into the outside pocket of the specimen biohazard bag and returned to the [facility] lab (basement floor of [facility]) within 2 days of collection. Please make sure to give the specimen to a staff member at the lab. DO NOT leave the specimen on the counter.   If the specimen date and time (can be found in the upper right boxed portion of the sheet) are not filled out on the E-Req sheet, the test will NOT be performed.   It was a pleasure to see you today!  Thank you for trusting me with your gastrointestinal care!

## 2024-03-28 ENCOUNTER — Other Ambulatory Visit (INDEPENDENT_AMBULATORY_CARE_PROVIDER_SITE_OTHER): Payer: MEDICAID

## 2024-03-28 DIAGNOSIS — R197 Diarrhea, unspecified: Secondary | ICD-10-CM | POA: Diagnosis not present

## 2024-03-31 LAB — CALPROTECTIN, FECAL: Calprotectin, Fecal: 13 ug/g (ref 0–120)

## 2024-04-01 ENCOUNTER — Telehealth: Payer: Self-pay | Admitting: Gastroenterology

## 2024-04-01 MED ORDER — AZITHROMYCIN 500 MG PO TABS: 500.0000 mg | ORAL_TABLET | Freq: Every day | ORAL | 0 refills | Status: AC

## 2024-04-01 NOTE — Telephone Encounter (Addendum)
 After meals will be nauseous. Can take the tiniest bite of bread and become nauseous and does vomit every every morning. Only having 2 bm a day but its a lot of diarrhea and having general abdomen pain and doesn't think the recent lab results is the cause because she has been having these symptoms for the last past 8 months she says. Please advise

## 2024-04-01 NOTE — Telephone Encounter (Signed)
 Patient made aware.

## 2024-04-01 NOTE — Addendum Note (Signed)
 Addended by: KATHIE BOTTCHER E on: 04/01/2024 02:59 PM   Modules accepted: Orders

## 2024-04-01 NOTE — Telephone Encounter (Signed)
 Diatherix testing came back with positive E. coli and positive norovirus. Likely the cause of her nausea, vomiting, and diarrhea.  These are self-limiting infectious process. to be treated with hydration, electrolytes, rest and they typically resolve on their own.  If persistent diarrhea or greater than 7 stools per day, can consider 3 day dose of abx  Please inform patient

## 2024-04-02 ENCOUNTER — Ambulatory Visit: Payer: MEDICAID | Admitting: Gastroenterology

## 2024-04-05 ENCOUNTER — Ambulatory Visit (HOSPITAL_COMMUNITY)
Admission: RE | Admit: 2024-04-05 | Discharge: 2024-04-05 | Disposition: A | Payer: MEDICAID | Source: Ambulatory Visit | Attending: Gastroenterology

## 2024-04-05 DIAGNOSIS — R1013 Epigastric pain: Secondary | ICD-10-CM | POA: Insufficient documentation

## 2024-04-05 DIAGNOSIS — R112 Nausea with vomiting, unspecified: Secondary | ICD-10-CM | POA: Insufficient documentation

## 2024-04-05 MED ORDER — TECHNETIUM TC 99M SULFUR COLLOID
2.0000 | Freq: Once | INTRAVENOUS | Status: AC | PRN
  Administered 2024-04-05: 2.18 via INTRAVENOUS

## 2024-05-31 ENCOUNTER — Ambulatory Visit: Payer: MEDICAID | Admitting: Gastroenterology
